# Patient Record
Sex: Female | Born: 1970
Health system: Southern US, Community
[De-identification: ages and names within clinical notes are randomized; demographics above are authoritative.]

## PROBLEM LIST (undated history)

## (undated) DIAGNOSIS — K219 Gastro-esophageal reflux disease without esophagitis: Secondary | ICD-10-CM

## (undated) DIAGNOSIS — N760 Acute vaginitis: Secondary | ICD-10-CM

## (undated) DIAGNOSIS — B009 Herpesviral infection, unspecified: Secondary | ICD-10-CM

## (undated) DIAGNOSIS — N83209 Unspecified ovarian cyst, unspecified side: Secondary | ICD-10-CM

## (undated) DIAGNOSIS — B379 Candidiasis, unspecified: Secondary | ICD-10-CM

## (undated) DIAGNOSIS — R55 Syncope and collapse: Principal | ICD-10-CM

## (undated) DIAGNOSIS — T7840XA Allergy, unspecified, initial encounter: Secondary | ICD-10-CM

## (undated) DIAGNOSIS — D179 Benign lipomatous neoplasm, unspecified: Secondary | ICD-10-CM

## (undated) DIAGNOSIS — K589 Irritable bowel syndrome without diarrhea: Secondary | ICD-10-CM

## (undated) DIAGNOSIS — B9689 Other specified bacterial agents as the cause of diseases classified elsewhere: Secondary | ICD-10-CM

## (undated) HISTORY — DX: Syncope and collapse: R55

## (undated) HISTORY — DX: Allergy, unspecified, initial encounter: T78.40XA

## (undated) HISTORY — DX: Benign lipomatous neoplasm, unspecified: D17.9

## (undated) HISTORY — DX: Herpesviral infection, unspecified: B00.9

## (undated) HISTORY — DX: Candidiasis, unspecified: B37.9

## (undated) HISTORY — DX: Unspecified ovarian cyst, unspecified side: N83.209

## (undated) HISTORY — DX: Other specified bacterial agents as the cause of diseases classified elsewhere: N76.0

## (undated) HISTORY — DX: Acute vaginitis: B96.89

## (undated) HISTORY — DX: Irritable bowel syndrome, unspecified: K58.9

---

## 1989-04-20 HISTORY — PX: LAPAROSCOPY: SHX197

## 1992-04-20 HISTORY — PX: LIPOMA EXCISION: SHX5283

## 2006-04-20 HISTORY — PX: BREAST BIOPSY: SHX20

## 2012-02-09 ENCOUNTER — Encounter: Payer: Self-pay | Admitting: Obstetrics and Gynecology

## 2012-02-09 ENCOUNTER — Ambulatory Visit (INDEPENDENT_AMBULATORY_CARE_PROVIDER_SITE_OTHER): Payer: BC Managed Care – PPO | Admitting: Obstetrics and Gynecology

## 2012-02-09 VITALS — BP 112/62 | Ht 69.0 in | Wt 154.0 lb

## 2012-02-09 DIAGNOSIS — Z124 Encounter for screening for malignant neoplasm of cervix: Secondary | ICD-10-CM

## 2012-02-09 DIAGNOSIS — B009 Herpesviral infection, unspecified: Secondary | ICD-10-CM | POA: Insufficient documentation

## 2012-02-09 DIAGNOSIS — Z7251 High risk heterosexual behavior: Secondary | ICD-10-CM

## 2012-02-09 DIAGNOSIS — D219 Benign neoplasm of connective and other soft tissue, unspecified: Secondary | ICD-10-CM | POA: Insufficient documentation

## 2012-02-09 DIAGNOSIS — N926 Irregular menstruation, unspecified: Secondary | ICD-10-CM

## 2012-02-09 DIAGNOSIS — D259 Leiomyoma of uterus, unspecified: Secondary | ICD-10-CM

## 2012-02-09 DIAGNOSIS — N946 Dysmenorrhea, unspecified: Secondary | ICD-10-CM | POA: Insufficient documentation

## 2012-02-09 LAB — HEPATITIS B SURFACE ANTIGEN: Hepatitis B Surface Ag: NEGATIVE

## 2012-02-09 LAB — RPR

## 2012-02-09 NOTE — Progress Notes (Signed)
ANNUAL: New GYN  Last Pap: 08/2010 WNL: Yes Regular Periods:no Contraception: Condom  Monthly Breast exam:no Tetanus<70yrs:yes Nl.Bladder Function:yes Daily BMs:yes Healthy Diet:yes Calcium:no Mammogram:yes Date of Mammogram: 11/2011 Exercise:yes Have often Exercise: occasionally Seatbelt: yes Abuse at home: no Stressful work:yes Sigmoid-colonoscopy: Yes Bone Density: No PCP: Dr. Ursula Beath Change in PMH: None Change in [facility]: None  Pt c/o irregular cycles. States menstrual attempted to start on 10/14, started heavy on 10/16 and was off on 10/18. Then attempted to start again on 10/20-10/21. C/o lower left abdomin and pelvic. Also wants to discuss bc pill. Pt states that she had BV in 02/20103. Wants STD testing   Subjective:    Deanna [name] is a 41 y.o. female, [id number], who presents for an annual exam.     History   Social History  . Marital Status: Single    Spouse Name: N/A    Number of Children: N/A  . Years of Education: N/A   Social History Main Topics  . Smoking status: Never Smoker   . Smokeless tobacco: Never Used  . Alcohol Use: Yes  . Drug Use: No  . Sexually Active: Yes    Birth Control/ Protection: Condom   Other Topics Concern  . None   Social History Narrative  . None    Menstrual cycle:   LMP: Patient's last menstrual period was 02/01/2012.           Cycle: Unpredictable, but usually once monthly for 4-7 days  The following portions of the patient's history were reviewed and updated as appropriate: allergies, current medications, past family history, past medical history, past social history, past surgical history and problem list.  Review of Systems Pertinent items are noted in HPI. Breast:Negative for breast lump,nipple discharge or nipple retraction Gastrointestinal: Negative for abdominal pain, change in bowel habits or rectal bleeding Urinary:negative   Objective:    LMP 02/01/2012    Weight:  Wt Readings from Last 1  Encounters:  No data found for Wt          BMI: There is no height or weight on file to calculate BMI.  General Appearance: Alert, appropriate appearance for age. No acute distress HEENT: Grossly normal Neck / Thyroid: Supple, no masses, nodes or enlargement Lungs: clear to auscultation bilaterally Back: No CVA tenderness Breast Exam: No masses or nodes.No dimpling, nipple retraction or discharge. Cardiovascular: Regular rate and rhythm. S1, S2, no murmur Gastrointestinal: Soft, non-tender, no masses or organomegaly Pelvic Exam: External genitalia: normal general appearance Vaginal: normal rugae Cervix: normal appearance Adnexa: no masses noted Uterus: anteverted, irregular enlargement and 12 weeks size and nontender Rectal: no masses Rectovaginal: normal rectal, no masses Lymphatic Exam: Non-palpable nodes in neck, clavicular, axillary, or inguinal regions Skin: no rash or abnormalities Neurologic: Normal gait and speech, no tremor  Psychiatric: Alert and oriented, appropriate affect.   Wet Prep:not applicable Urinalysis:not applicable UPT: Negative   Assessment:    fibroids  Irregular menstrual bleeding Wants contraception   Plan:    mammogram pap smear [facility] STD screening: agreed Contraception:oral contraceptives (estrogen/progesterone) desired.  Will rx at time of shg   Dierdre Forth MD

## 2012-02-10 LAB — HSV 1 ANTIBODY, IGG: HSV 1 Glycoprotein G Ab, IgG: 9.53 IV — ABNORMAL HIGH

## 2012-02-10 LAB — HSV 2 ANTIBODY, IGG: HSV 2 Glycoprotein G Ab, IgG: 0.15 IV

## 2012-02-11 ENCOUNTER — Encounter: Payer: Self-pay | Admitting: Obstetrics and Gynecology

## 2012-02-11 ENCOUNTER — Telehealth: Payer: Self-pay | Admitting: Obstetrics and Gynecology

## 2012-02-11 ENCOUNTER — Ambulatory Visit (INDEPENDENT_AMBULATORY_CARE_PROVIDER_SITE_OTHER): Payer: BC Managed Care – PPO | Admitting: Obstetrics and Gynecology

## 2012-02-11 ENCOUNTER — Other Ambulatory Visit: Payer: Self-pay | Admitting: Obstetrics and Gynecology

## 2012-02-11 ENCOUNTER — Ambulatory Visit (INDEPENDENT_AMBULATORY_CARE_PROVIDER_SITE_OTHER): Payer: BC Managed Care – PPO

## 2012-02-11 VITALS — BP 110/60 | Ht 69.0 in | Wt 152.0 lb

## 2012-02-11 DIAGNOSIS — D259 Leiomyoma of uterus, unspecified: Secondary | ICD-10-CM

## 2012-02-11 DIAGNOSIS — D219 Benign neoplasm of connective and other soft tissue, unspecified: Secondary | ICD-10-CM

## 2012-02-11 DIAGNOSIS — N939 Abnormal uterine and vaginal bleeding, unspecified: Secondary | ICD-10-CM

## 2012-02-11 DIAGNOSIS — R102 Pelvic and perineal pain: Secondary | ICD-10-CM

## 2012-02-11 DIAGNOSIS — N926 Irregular menstruation, unspecified: Secondary | ICD-10-CM

## 2012-02-11 DIAGNOSIS — N949 Unspecified condition associated with female genital organs and menstrual cycle: Secondary | ICD-10-CM

## 2012-02-11 LAB — PAP IG, CT-NG NAA, HPV HIGH-RISK
Chlamydia Probe Amp: NEGATIVE
HPV DNA High Risk: NOT DETECTED

## 2012-02-11 MED ORDER — LEVONORGESTREL-ETHINYL ESTRAD 0.1-20 MG-MCG PO TABS: 1.0000 | ORAL_TABLET | Freq: Every day | ORAL | Status: DC

## 2012-02-11 MED ORDER — VALACYCLOVIR HCL 500 MG PO TABS: 500.0000 mg | ORAL_TABLET | Freq: Every day | ORAL | Status: DC

## 2012-02-11 NOTE — Progress Notes (Signed)
GYN PROBLEM VISIT  Ms. Deanna [name] is a 41 y.o. year old female,G3P0021, who presents for a problem visit. She has irregular menses and abdominal pelvic pain  Subjective: Had one episode of severe LLQ pain in the spring.  Seen by MD who thought one of her "fibroids had ruptured".  Pain resolved over time and has not returned with that severity.  Objective:  BP 110/60  Ht 5\' 9"  (1.753 m)  Wt 152 lb (68.947 kg)  BMI 22.45 kg/m2  LMP 02/01/2012   SONOHYSTEROGRAM  Indications for the procedure, risks and benefits have been reviewed.  Questions were answered.   A permit has been signed.  An ultrasound was performed.   The uterus measured 10 cm x 8 cm.   The endometrial thickness was 7.21mm.   The ovaries nl   PROCEDURE The vagina and cervix were prepped with Betadine.  The sonohysterogram catheter was placed inside the uterus.  15 cc of sterile saline were injected.  A 3-D ultrasound was performed.  Findings include:  6 fibroids Largest 3.97cm    No endometrial lesions    EM cavity width=2.5cm    EM cavity length=5.7cm .   ENDOMETRIAL BIOPSY   INDICATION: intermenstrual bleeding  LMP: 02/01/12 UPT: Negative  Ultrasound findings: see above Risks and benefits have been discussed  PROCEDURE Cervix prepped with betadine Tenaculum applied to anterior lip of the cervix: no Uterus sounded at 7  cm Endometrial biopsy easily performed with Pipelle Well tolerated   Assessment: Fibroids  Episode intermenstrual bleeding Needs contraception  Plan: Endo bx with results by phone Start Alesse today F/U 6 wks for review of bleeding and pain  Dierdre Forth, MD  02/11/2012 7:05 PM

## 2012-02-12 ENCOUNTER — Other Ambulatory Visit: Payer: Self-pay

## 2012-02-12 MED ORDER — ACYCLOVIR 200 MG PO CAPS: 200.0000 mg | ORAL_CAPSULE | Freq: Every day | ORAL | Status: DC

## 2012-03-23 ENCOUNTER — Encounter: Payer: Self-pay | Admitting: Obstetrics and Gynecology

## 2012-03-23 ENCOUNTER — Ambulatory Visit (INDEPENDENT_AMBULATORY_CARE_PROVIDER_SITE_OTHER): Payer: BC Managed Care – PPO | Admitting: Obstetrics and Gynecology

## 2012-03-23 VITALS — BP 110/68 | HR 74 | Wt 115.0 lb

## 2012-03-23 DIAGNOSIS — D219 Benign neoplasm of connective and other soft tissue, unspecified: Secondary | ICD-10-CM

## 2012-03-23 DIAGNOSIS — N946 Dysmenorrhea, unspecified: Secondary | ICD-10-CM

## 2012-03-23 DIAGNOSIS — D259 Leiomyoma of uterus, unspecified: Secondary | ICD-10-CM

## 2012-03-23 NOTE — Progress Notes (Signed)
Subjective:    Deanna [name] is a 41 y.o. female, G3P0021, who presents for 6 weeks f/u for Birth Control.Pt stated been having occassional  leg cramps and  Some nausea which she has had associated with GERD since she only takes her medication prn.  Pt had a mammogram 6 mos ago at the [facility] hospital with the recommendation for followup in 6 mos.  She wants a second opinion concerning this recommendation   The following portions of the patient's history were reviewed and updated as appropriate: allergies, current medications, past family history.  Review of Systems Pertinent items are noted in HPI. Breast:Negative for breast lump,nipple discharge or nipple retraction Gastrointestinal: Negative for abdominal pain, change in bowel habits or rectal bleeding Urinary:negative   Objective:    BP 110/68  Pulse 74  Wt 115 lb (52.164 kg)  LMP 03/15/2012    Weight:  Wt Readings from Last 1 Encounters:  03/23/12 115 lb (52.164 kg)          BMI: There is no height on file to calculate BMI.  General Appearance: Alert, appropriate appearance for age. No acute distress    Assessment:    Fibroids  Menorrhagia Dysmenorrhea  All improved on bcps Needs short interval mg f/u   Plan:    Continue bcps Referred to Breast center for second opinion F/u for aex  Dierdre Forth MD

## 2012-03-30 ENCOUNTER — Telehealth: Payer: Self-pay

## 2012-03-30 NOTE — Telephone Encounter (Signed)
Tc to pt per [facility] recs. Pt informed per Breast Center will need last 2 copies of mammogram/pictures. Pt voices understanding. They will need these records before appt can be sched. After review of records, Breast Center will call pt to sched appt for 2nd opinion. Pt will drop of records to Breast center herself. Pt agrees.

## 2012-03-30 NOTE — Telephone Encounter (Signed)
Message copied by Raylene Everts on Wed Mar 30, 2012 10:16 AM ------      Message from: Dierdre Forth P      Created: Wed Mar 23, 2012 10:22 AM      Regarding: Radiology consultation       Pt had mammogram at [facility] in [place] and wants a second reading to determine need for followup they have recommended.  Has had a left breast bx more than 5 years ago.      Please call breast center to see what is required to give this opinion

## 2012-11-03 ENCOUNTER — Emergency Department (HOSPITAL_COMMUNITY)
Admission: EM | Admit: 2012-11-03 | Discharge: 2012-11-03 | Disposition: A | Discharge status: Home / Self Care | Payer: BC Managed Care – PPO | Attending: Emergency Medicine | Admitting: Emergency Medicine

## 2012-11-03 ENCOUNTER — Encounter (HOSPITAL_COMMUNITY): Payer: Self-pay

## 2012-11-03 ENCOUNTER — Emergency Department (HOSPITAL_COMMUNITY): Payer: BC Managed Care – PPO

## 2012-11-03 DIAGNOSIS — IMO0002 Reserved for concepts with insufficient information to code with codable children: Secondary | ICD-10-CM | POA: Insufficient documentation

## 2012-11-03 DIAGNOSIS — Z8742 Personal history of other diseases of the female genital tract: Secondary | ICD-10-CM | POA: Insufficient documentation

## 2012-11-03 DIAGNOSIS — K219 Gastro-esophageal reflux disease without esophagitis: Secondary | ICD-10-CM

## 2012-11-03 DIAGNOSIS — Z79899 Other long term (current) drug therapy: Secondary | ICD-10-CM | POA: Insufficient documentation

## 2012-11-03 DIAGNOSIS — Z8619 Personal history of other infectious and parasitic diseases: Secondary | ICD-10-CM | POA: Insufficient documentation

## 2012-11-03 DIAGNOSIS — Z8719 Personal history of other diseases of the digestive system: Secondary | ICD-10-CM | POA: Insufficient documentation

## 2012-11-03 DIAGNOSIS — Z3202 Encounter for pregnancy test, result negative: Secondary | ICD-10-CM | POA: Insufficient documentation

## 2012-11-03 LAB — HEPATIC FUNCTION PANEL
AST: 15 U/L (ref 0–37)
Bilirubin, Direct: 0.1 mg/dL (ref 0.0–0.3)
Indirect Bilirubin: 0.7 mg/dL (ref 0.3–0.9)

## 2012-11-03 LAB — CBC
MCV: 84.4 fL (ref 78.0–100.0)
Platelets: 235 10*3/uL (ref 150–400)
RDW: 13.4 % (ref 11.5–15.5)
WBC: 4.2 10*3/uL (ref 4.0–10.5)

## 2012-11-03 LAB — BASIC METABOLIC PANEL
Chloride: 103 mEq/L (ref 96–112)
Creatinine, Ser: 0.68 mg/dL (ref 0.50–1.10)
GFR calc Af Amer: >90 mL/min (ref >90)
GFR calc non Af Amer: >90 mL/min (ref >90)

## 2012-11-03 LAB — TROPONIN I: Troponin I: <0.3 ng/mL (ref <0.30)

## 2012-11-03 LAB — POCT PREGNANCY, URINE: Preg Test, Ur: NEGATIVE

## 2012-11-03 LAB — LIPASE, BLOOD: Lipase: 39 U/L (ref 11–59)

## 2012-11-03 MED ORDER — PANTOPRAZOLE SODIUM 20 MG PO TBEC: 40.0000 mg | DELAYED_RELEASE_TABLET | Freq: Every day | ORAL | Status: DC

## 2012-11-03 NOTE — ED Provider Notes (Signed)
History    CSN: [id number] Arrival date & time 11/03/12  1122  First MD Initiated Contact with Patient 11/03/12 1139     Chief Complaint  Patient presents with  . Chest Pain   (Consider location/radiation/quality/duration/timing/severity/associated sxs/prior Treatment) Patient is a 42 y.o. female presenting with chest pain. The history is provided by the patient (pt complains burning in chest).  Chest Pain Pain location:  Substernal area Pain quality: burning   Pain radiates to:  Does not radiate Pain radiates to the back: no   Pain severity:  Moderate Onset quality:  Gradual Timing:  Constant Associated symptoms: no abdominal pain, no back pain, no cough, no fatigue and no headache    Past Medical History  Diagnosis Date  . Yeast infection   . BV (bacterial vaginosis)   . Ovarian cyst   . HSV-1 infection   . IBS (irritable bowel syndrome)    Past Surgical History  Procedure Laterality Date  . Breast biopsy  2008  . Laparoscopy  1991    no endometriosis found   Family History  Problem Relation Age of Onset  . Heart disease Maternal Grandmother   . Hypertension Maternal Grandmother   . Sickle cell trait Cousin   . Cancer Maternal Aunt   . Anemia Mother   . Throat cancer Paternal Uncle   . Cancer Paternal Uncle     unsure ?    History  Substance Use Topics  . Smoking status: Never Smoker   . Smokeless tobacco: Never Used  . Alcohol Use: Yes     Comment: wine occassional   OB History   Grav Para Term Preterm Abortions TAB SAB Ect Mult Living   3 1   2 1 1   1      Review of Systems  Constitutional: Negative for appetite change and fatigue.  HENT: Negative for congestion, sinus pressure and ear discharge.   Eyes: Negative for discharge.  Respiratory: Negative for cough.   Cardiovascular: Positive for chest pain.  Gastrointestinal: Negative for abdominal pain and diarrhea.  Genitourinary: Negative for frequency and hematuria.  Musculoskeletal: Negative  for back pain.  Skin: Negative for rash.  Neurological: Negative for seizures and headaches.  Psychiatric/Behavioral: Negative for hallucinations.    Allergies  Review of patient's allergies indicates no known allergies.  Home Medications   Current Outpatient Rx  Name  Route  Sig  Dispense  Refill  . acyclovir (ZOVIRAX) 200 MG capsule   Oral   Take 1 capsule (200 mg total) by mouth 5 (five) times daily.   30 capsule   12   . ibuprofen (ADVIL,MOTRIN) 200 MG tablet   Oral   Take 200 mg by mouth every 6 (six) hours as needed for pain.         Marland Kitchen levonorgestrel-ethinyl estradiol (AVIANE,ALESSE,LESSINA) 0.1-20 MG-MCG tablet   Oral   Take 1 tablet by mouth daily.         . mometasone (NASONEX) 50 MCG/ACT nasal spray   Nasal   Place 2 sprays into the nose daily.         . Multiple Vitamins-Minerals (ONE-A-DAY MENS VITACRAVES) CHEW   Oral   Chew 1 tablet by mouth daily.         . naproxen sodium (ANAPROX) 220 MG tablet   Oral   Take 220 mg by mouth 2 (two) times daily as needed. For pain         . Omega-3 Fatty Acids (FISH OIL) 1000 MG  CAPS   Oral   Take 1 capsule by mouth daily.         . pantoprazole (PROTONIX) 20 MG tablet   Oral   Take 2 tablets (40 mg total) by mouth daily.   30 tablet   1    BP 135/87  Pulse 82  Temp(Src) 98.6 F (37 C) (Oral)  Resp 18  SpO2 100%  LMP 10/10/2012 Physical Exam  Constitutional: She is oriented to person, place, and time. She appears well-developed.  HENT:  Head: Normocephalic.  Eyes: Conjunctivae and EOM are normal. No scleral icterus.  Neck: Neck supple. No thyromegaly present.  Cardiovascular: Normal rate and regular rhythm.  Exam reveals no gallop and no friction rub.   No murmur heard. Pulmonary/Chest: No stridor. She has no wheezes. She has no rales. She exhibits no tenderness.  Abdominal: She exhibits no distension. There is no tenderness. There is no rebound.  Musculoskeletal: Normal range of motion.  She exhibits no edema.  Lymphadenopathy:    She has no cervical adenopathy.  Neurological: She is oriented to person, place, and time. Coordination normal.  Skin: No rash noted. No erythema.  Psychiatric: She has a normal mood and affect. Her behavior is normal.    ED Course  Procedures (including critical care time) Labs Reviewed  CBC - Abnormal; Notable for the following:    HCT 35.7 (*)    All other components within normal limits  BASIC METABOLIC PANEL - Abnormal; Notable for the following:    BUN 5 (*)    All other components within normal limits  TROPONIN I  HEPATIC FUNCTION PANEL  LIPASE, BLOOD  POCT PREGNANCY, URINE   Dg Chest 2 View  11/03/2012   *RADIOLOGY REPORT*  Clinical Data: Inferior retrosternal chest pain radiating bilaterally under the breasts for 1 day, history asthma, nausea  CHEST - 2 VIEW  Comparison: None  Findings: Normal heart size, mediastinal contours, and pulmonary vascularity. Lungs clear. Bones unremarkable. No pneumothorax.  IMPRESSION: Normal exam.   Original Report Authenticated By: Ulyses Southward, M.D.   Dg Abd 2 Views  11/03/2012   *RADIOLOGY REPORT*  Clinical Data: Inferior retrosternal chest pain radiating bilaterally under the breasts for 1 day, nausea, reflux like symptoms, history endometriosis  ABDOMEN - 2 VIEW  Comparison: None  Findings: Normal bowel gas pattern. No bowel dilatation, bowel wall thickening or free intraperitoneal air. No urinary tract calcification or a few focal bony lesions. Large rounded calcifications in the pelvis potentially may represent calcified uterine leiomyomata, largest 4.5 x 4.0 cm in size.  IMPRESSION: Normal bowel gas pattern. Pelvic calcifications question calcified uterine leiomyomata.   Original Report Authenticated By: Ulyses Southward, M.D.   1. GERD (gastroesophageal reflux disease)     Date: 11/03/2012  Rate: 79  Rhythm: normal sinus rhythm  QRS Axis: normal  Intervals: normal  ST/T Wave abnormalities: normal   Conduction Disutrbances:none  Narrative Interpretation:   Old EKG Reviewed: none available   MDM  gerd  Benny Lennert, MD 11/03/12 1309

## 2012-11-03 NOTE — ED Notes (Signed)
Patient transported to X-ray 

## 2012-11-03 NOTE — ED Notes (Signed)
Pt. Developed intermittent chest pain began Early Tuesday morning, woke the pt.up.  She describes the pain as a cramping and it is intermittent.  The pain  Radiates under the lt. Breast.  Also having cramping in both legs and pain in the top of her feet.  Pt, is not having any sob.   Pt. Is having allergies.skin is warm and dry.

## 2012-11-03 NOTE — ED Notes (Signed)
Returned from xray

## 2013-04-25 ENCOUNTER — Encounter (HOSPITAL_COMMUNITY): Payer: Self-pay | Admitting: Emergency Medicine

## 2013-04-25 ENCOUNTER — Emergency Department (HOSPITAL_COMMUNITY)
Admission: EM | Admit: 2013-04-25 | Discharge: 2013-04-25 | Disposition: A | Discharge status: Home / Self Care | Payer: BC Managed Care – PPO | Source: Home / Self Care | Attending: Family Medicine | Admitting: Family Medicine

## 2013-04-25 ENCOUNTER — Telehealth (HOSPITAL_COMMUNITY): Payer: Self-pay | Admitting: *Deleted

## 2013-04-25 DIAGNOSIS — R110 Nausea: Secondary | ICD-10-CM

## 2013-04-25 LAB — CBC
HCT: 38.1 % (ref 36.0–46.0)
Hemoglobin: 12.6 g/dL (ref 12.0–15.0)
MCH: 28.8 pg (ref 26.0–34.0)
MCHC: 33.1 g/dL (ref 30.0–36.0)
MCV: 87 fL (ref 78.0–100.0)
PLATELETS: 196 10*3/uL (ref 150–400)
RBC: 4.38 MIL/uL (ref 3.87–5.11)
RDW: 13 % (ref 11.5–15.5)
WBC: 4.9 10*3/uL (ref 4.0–10.5)

## 2013-04-25 LAB — LIPASE, BLOOD: LIPASE: 43 U/L (ref 11–59)

## 2013-04-25 LAB — POCT URINALYSIS DIP (DEVICE)
BILIRUBIN URINE: NEGATIVE
GLUCOSE, UA: NEGATIVE mg/dL
Hgb urine dipstick: NEGATIVE
Ketones, ur: NEGATIVE mg/dL
NITRITE: NEGATIVE
PH: 6.5 (ref 5.0–8.0)
Protein, ur: NEGATIVE mg/dL
Specific Gravity, Urine: 1.015 (ref 1.005–1.030)
Urobilinogen, UA: 0.2 mg/dL (ref 0.0–1.0)

## 2013-04-25 LAB — COMPREHENSIVE METABOLIC PANEL
ALK PHOS: 68 U/L (ref 39–117)
ALT: 10 U/L (ref 0–35)
AST: 13 U/L (ref 0–37)
Albumin: 4.1 g/dL (ref 3.5–5.2)
BILIRUBIN TOTAL: 0.5 mg/dL (ref 0.3–1.2)
BUN: 10 mg/dL (ref 6–23)
CHLORIDE: 100 meq/L (ref 96–112)
CO2: 26 mEq/L (ref 19–32)
CREATININE: 0.7 mg/dL (ref 0.50–1.10)
Calcium: 8.8 mg/dL (ref 8.4–10.5)
GLUCOSE: 84 mg/dL (ref 70–99)
POTASSIUM: 3.9 meq/L (ref 3.7–5.3)
Sodium: 138 mEq/L (ref 137–147)
Total Protein: 7.6 g/dL (ref 6.0–8.3)

## 2013-04-25 LAB — POCT PREGNANCY, URINE: PREG TEST UR: NEGATIVE

## 2013-04-25 MED ORDER — ONDANSETRON HCL 8 MG PO TABS: 8.0000 mg | ORAL_TABLET | Freq: Three times a day (TID) | ORAL | Status: DC | PRN

## 2013-04-25 NOTE — Discharge Instructions (Signed)
Thank you for coming in today. Use Zofran as needed Try stopping omega-3 fatty acid as this may cause some upset stomach Continue Protonix Followup with a primary care provider I will call you if any labs are very abnormal  Nausea, Adult Nausea is the feeling that you have an upset stomach or have to vomit. Nausea by itself is not likely a serious concern, but it may be an early sign of more serious medical problems. As nausea gets worse, it can lead to vomiting. If vomiting develops, there is the risk of dehydration.  CAUSES   Viral infections.  Food poisoning.  Medicines.  Pregnancy.  Motion sickness.  Migraine headaches.  Emotional distress.  Severe pain from any source.  Alcohol intoxication. HOME CARE INSTRUCTIONS  Get plenty of rest.  Ask your caregiver about specific rehydration instructions.  Eat small amounts of food and sip liquids more often.  Take all medicines as told by your caregiver. SEEK MEDICAL CARE IF:  You have not improved after 2 days, or you get worse.  You have a headache. SEEK IMMEDIATE MEDICAL CARE IF:   You have a fever.  You faint.  You keep vomiting or have blood in your vomit.  You are extremely weak or dehydrated.  You have dark or bloody stools.  You have severe chest or abdominal pain. MAKE SURE YOU:  Understand these instructions.  Will watch your condition.  Will get help right away if you are not doing well or get worse. Document Released: 05/14/2004 Document Revised: 12/30/2011 Document Reviewed: 12/17/2010 [facility] Patient Information 2014 [place], [place].

## 2013-04-25 NOTE — ED Provider Notes (Signed)
Deanna [name] is a 43 y.o. female who presents to Urgent Care today for one week of nausea. Patient has tried taking laxatives and multivitamins which haven't helped. No constipation vomiting diarrhea. No fevers or chills. Patient had a negative home pregnancy test. She denies abdominal pain shortness of breath fevers or chills. She has been exposed to someone recently who developed gastroenteritis. She feels well otherwise. No dysuria or urinary frequency or urgency.  Past Medical History  Diagnosis Date  . Yeast infection   . BV (bacterial vaginosis)   . Ovarian cyst   . HSV-1 infection   . IBS (irritable bowel syndrome)    History  Substance Use Topics  . Smoking status: Never Smoker   . Smokeless tobacco: Never Used  . Alcohol Use: Yes     Comment: wine occassional   ROS as above Medications reviewed. No current facility-administered medications for this encounter.   Current Outpatient Prescriptions  Medication Sig Dispense Refill  . cetirizine (ZYRTEC) 10 MG tablet Take 10 mg by mouth daily.      . tranexamic acid (LYSTEDA) 650 MG TABS tablet Take 1,300 mg by mouth 3 (three) times daily.      Marland Kitchen acyclovir (ZOVIRAX) 200 MG capsule Take 1 capsule (200 mg total) by mouth 5 (five) times daily.  30 capsule  12  . ibuprofen (ADVIL,MOTRIN) 200 MG tablet Take 200 mg by mouth every 6 (six) hours as needed for pain.      Marland Kitchen levonorgestrel-ethinyl estradiol (AVIANE,ALESSE,LESSINA) 0.1-20 MG-MCG tablet Take 1 tablet by mouth daily.      . mometasone (NASONEX) 50 MCG/ACT nasal spray Place 2 sprays into the nose daily.      . Multiple Vitamins-Minerals (ONE-A-DAY MENS VITACRAVES) CHEW Chew 1 tablet by mouth daily.      . naproxen sodium (ANAPROX) 220 MG tablet Take 220 mg by mouth 2 (two) times daily as needed. For pain      . Omega-3 Fatty Acids (FISH OIL) 1000 MG CAPS Take 1 capsule by mouth daily.      . ondansetron (ZOFRAN) 8 MG tablet Take 1 tablet (8 mg total) by mouth every 8 (eight)  hours as needed for nausea or vomiting.  20 tablet  0  . pantoprazole (PROTONIX) 20 MG tablet Take 2 tablets (40 mg total) by mouth daily.  30 tablet  1    Exam:  BP 108/71  Pulse 78  Temp(Src) 98 F (36.7 C) (Oral)  Resp 18  SpO2 100%  LMP 04/02/2013 Gen: Well NAD HEENT: EOMI,  MMM posterior pharynx is normal appearing Lungs: Normal work of breathing. CTABL Heart: RRR no MRG Abd: NABS, Soft. NT, ND no CV angle tenderness to percussion Exts: Non edematous BL  LE, warm and well perfused.   Results for orders placed during the hospital encounter of 04/25/13 (from the past 24 hour(s))  COMPREHENSIVE METABOLIC PANEL     Status: None   Collection Time    04/25/13 12:28 PM      Result Value Range   Sodium 138  137 - 147 mEq/L   Potassium 3.9  3.7 - 5.3 mEq/L   Chloride 100  96 - 112 mEq/L   CO2 26  19 - 32 mEq/L   Glucose, Bld 84  70 - 99 mg/dL   BUN 10  6 - 23 mg/dL   Creatinine, Ser 0.70  0.50 - 1.10 mg/dL   Calcium 8.8  8.4 - 10.5 mg/dL   Total Protein 7.6  6.0 -  8.3 g/dL   Albumin 4.1  3.5 - 5.2 g/dL   AST 13  0 - 37 U/L   ALT 10  0 - 35 U/L   Alkaline Phosphatase 68  39 - 117 U/L   Total Bilirubin 0.5  0.3 - 1.2 mg/dL   GFR calc non Af Amer >90  >90 mL/min   GFR calc Af Amer >90  >90 mL/min  CBC     Status: None   Collection Time    04/25/13 12:28 PM      Result Value Range   WBC 4.9  4.0 - 10.5 K/uL   RBC 4.38  3.87 - 5.11 MIL/uL   Hemoglobin 12.6  12.0 - 15.0 g/dL   HCT 38.1  36.0 - 46.0 %   MCV 87.0  78.0 - 100.0 fL   MCH 28.8  26.0 - 34.0 pg   MCHC 33.1  30.0 - 36.0 g/dL   RDW 13.0  11.5 - 15.5 %   Platelets 196  150 - 400 K/uL  LIPASE, BLOOD     Status: None   Collection Time    04/25/13 12:28 PM      Result Value Range   Lipase 43  11 - 59 U/L  POCT URINALYSIS DIP (DEVICE)     Status: Abnormal   Collection Time    04/25/13 12:39 PM      Result Value Range   Glucose, UA NEGATIVE  NEGATIVE mg/dL   Bilirubin Urine NEGATIVE  NEGATIVE   Ketones, ur  NEGATIVE  NEGATIVE mg/dL   Specific Gravity, Urine 1.015  1.005 - 1.030   Hgb urine dipstick NEGATIVE  NEGATIVE   pH 6.5  5.0 - 8.0   Protein, ur NEGATIVE  NEGATIVE mg/dL   Urobilinogen, UA 0.2  0.0 - 1.0 mg/dL   Nitrite NEGATIVE  NEGATIVE   Leukocytes, UA TRACE (*) NEGATIVE  POCT PREGNANCY, URINE     Status: None   Collection Time    04/25/13 12:39 PM      Result Value Range   Preg Test, Ur NEGATIVE  NEGATIVE   No results found.  Assessment and Plan: 43 y.o. female with nausea of unclear etiology. Exam and labs are largely normal. Urinalysis with trace leukocyte esterase without any urinary symptoms. I think this is likely not significant however a urine culture is pending. Plan to treat with Zofran and watchful waiting..  Discussed warning signs or symptoms. Please see discharge instructions. Patient expresses understanding.      Gregor Hams, MD 04/25/13 [phone]

## 2013-04-25 NOTE — ED Notes (Signed)
Pt. called for her lab results.  Pt. verified x 2 and given neg. results.  I told her the Urine culture would be back on Thur. I gave her my number to call on Fri for her result. Deanna [name] 04/25/2013

## 2013-04-25 NOTE — ED Notes (Signed)
C/o off and on nausea for over 1 week. States she has taken vitamins. Took a laxative to see if it was constipation, nausea did not go away. Also took a home pregnancy test that was negative.  Stated last night while walking in mall, she felt very week. Written by; Lenore Manner, SMA

## 2013-04-26 LAB — URINE CULTURE
COLONY COUNT: NO GROWTH
Culture: NO GROWTH
Special Requests: NORMAL

## 2013-05-01 ENCOUNTER — Other Ambulatory Visit: Payer: Self-pay | Admitting: Internal Medicine

## 2013-05-01 DIAGNOSIS — R110 Nausea: Secondary | ICD-10-CM

## 2013-05-03 ENCOUNTER — Ambulatory Visit
Admission: RE | Admit: 2013-05-03 | Discharge: 2013-05-03 | Disposition: A | Discharge status: Home / Self Care | Payer: BC Managed Care – PPO | Source: Ambulatory Visit | Attending: Internal Medicine | Admitting: Internal Medicine

## 2013-05-03 DIAGNOSIS — R110 Nausea: Secondary | ICD-10-CM

## 2013-05-30 ENCOUNTER — Other Ambulatory Visit: Payer: Self-pay | Admitting: Nurse Practitioner

## 2013-05-30 ENCOUNTER — Ambulatory Visit
Admission: RE | Admit: 2013-05-30 | Discharge: 2013-05-30 | Disposition: A | Discharge status: Home / Self Care | Payer: BC Managed Care – PPO | Source: Ambulatory Visit | Attending: Nurse Practitioner | Admitting: Nurse Practitioner

## 2013-05-30 DIAGNOSIS — M79609 Pain in unspecified limb: Secondary | ICD-10-CM

## 2013-12-20 ENCOUNTER — Other Ambulatory Visit (HOSPITAL_COMMUNITY)
Admission: RE | Admit: 2013-12-20 | Discharge: 2013-12-20 | Disposition: A | Discharge status: Home / Self Care | Payer: BC Managed Care – PPO | Source: Ambulatory Visit | Attending: Family Medicine | Admitting: Family Medicine

## 2013-12-20 ENCOUNTER — Other Ambulatory Visit: Payer: Self-pay | Admitting: Physician Assistant

## 2013-12-20 DIAGNOSIS — Z124 Encounter for screening for malignant neoplasm of cervix: Secondary | ICD-10-CM | POA: Diagnosis present

## 2013-12-22 LAB — CYTOLOGY - PAP

## 2014-02-06 ENCOUNTER — Other Ambulatory Visit: Payer: Self-pay | Admitting: Physician Assistant

## 2014-02-06 DIAGNOSIS — R42 Dizziness and giddiness: Secondary | ICD-10-CM

## 2014-02-09 ENCOUNTER — Ambulatory Visit
Admission: RE | Admit: 2014-02-09 | Discharge: 2014-02-09 | Disposition: A | Discharge status: Home / Self Care | Payer: BC Managed Care – PPO | Source: Ambulatory Visit | Attending: Physician Assistant | Admitting: Physician Assistant

## 2014-02-09 DIAGNOSIS — R42 Dizziness and giddiness: Secondary | ICD-10-CM

## 2014-02-09 MED ORDER — GADOBENATE DIMEGLUMINE 529 MG/ML IV SOLN
14.0000 mL | Freq: Once | INTRAVENOUS | Status: AC | PRN
  Administered 2014-02-09: 14 mL via INTRAVENOUS

## 2014-02-19 ENCOUNTER — Encounter (HOSPITAL_COMMUNITY): Payer: Self-pay | Admitting: Emergency Medicine

## 2014-02-21 ENCOUNTER — Ambulatory Visit (INDEPENDENT_AMBULATORY_CARE_PROVIDER_SITE_OTHER): Payer: BC Managed Care – PPO | Admitting: Neurology

## 2014-02-21 ENCOUNTER — Encounter: Payer: Self-pay | Admitting: Neurology

## 2014-02-21 VITALS — BP 118/72 | HR 84 | Ht 69.0 in | Wt 157.2 lb

## 2014-02-21 DIAGNOSIS — R55 Syncope and collapse: Secondary | ICD-10-CM | POA: Insufficient documentation

## 2014-02-21 HISTORY — DX: Syncope and collapse: R55

## 2014-02-21 NOTE — Progress Notes (Signed)
Reason for visit: near-syncope  Deanna [name] is a 42 y.o. female  History of present illness:  Deanna [name] is a 43 year old right-handed black female with a 4 or 5 year history of episodes of near-syncope. The patient indicates that the events may occur once or twice a month, and there are no particular activating factors for them. The patient will have a sensation of feeling lightheaded as if she may faint. The patient is unable to continue to remain upright, and she will generally have to sit down or lie down. The patient indicates that even while lying down, she still feels somewhat dizzy lasting 20-30 minutes, with resolution at that time. The patient may have nausea associated with the events. The patient denies any focal numbness or weakness of the face, arms, or legs. She denies palpitations of the heart. She has had a 2-D echocardiogram many years ago for similar events, but she is unclear what the results of the study were. The patient denies any headache episodes with the events, and she denies any confusion or loss of consciousness. She is sent to this office for an evaluation. She has undergone MRI evaluation of the brain that was unremarkable.    Past Medical History  Diagnosis Date  . Yeast infection   . BV (bacterial vaginosis)   . Ovarian cyst   . HSV-1 infection   . IBS (irritable bowel syndrome)   . Syncope 02/21/2014  . Near syncope 02/21/2014    Past Surgical History  Procedure Laterality Date  . Breast biopsy  2008  . Laparoscopy  1991    no endometriosis found    Family History  Problem Relation Age of Onset  . Heart disease Maternal Grandmother   . Hypertension Maternal Grandmother   . Sickle cell trait Cousin   . Cancer Maternal Aunt   . Anemia Mother   . Throat cancer Paternal Uncle   . Cancer Paternal Uncle     unsure ?     Social history:  reports that she has never smoked. She has never used smokeless tobacco. She reports that she drinks  alcohol. She reports that she does not use illicit drugs.  Medications:  Current Outpatient Prescriptions on File Prior to Visit  Medication Sig Dispense Refill  . acyclovir (ZOVIRAX) 200 MG capsule Take 1 capsule (200 mg total) by mouth 5 (five) times daily. 30 capsule 12  . cetirizine (ZYRTEC) 10 MG tablet Take 10 mg by mouth daily.    Marland Kitchen ibuprofen (ADVIL,MOTRIN) 200 MG tablet Take 200 mg by mouth every 6 (six) hours as needed for pain.    . mometasone (NASONEX) 50 MCG/ACT nasal spray Place 2 sprays into the nose daily.    . Multiple Vitamins-Minerals (ONE-A-DAY MENS VITACRAVES) CHEW Chew 1 tablet by mouth daily.    . naproxen sodium (ANAPROX) 220 MG tablet Take 220 mg by mouth 2 (two) times daily as needed. For pain    . Omega-3 Fatty Acids (FISH OIL) 1000 MG CAPS Take 1 capsule by mouth daily.    . pantoprazole (PROTONIX) 20 MG tablet Take 2 tablets (40 mg total) by mouth daily. 30 tablet 1   No current facility-administered medications on file prior to visit.     No Known Allergies  ROS:  Out of a complete 14 system review of symptoms, the patient complains only of the following symptoms, and all other reviewed systems are negative.  Chest pain Ringing in the ears Moles Constipation Allergies, runny nose Headache,  weakness, dizziness Decreased energy Shift work  Blood pressure 118/72, pulse 84, height 5\' 9"  (1.753 m), weight 157 lb 3.2 oz (71.305 kg).   Blood pressure, sitting, right arm is 112/80. Blood pressure, right arm, standing is 726 systolic.  Physical Exam  General: The patient is alert and cooperative at the time of the examination.  Eyes: Pupils are equal, round, and reactive to light. Discs are flat bilaterally.  Neck: The neck is supple, no carotid bruits are noted.  Respiratory: The respiratory examination is clear.  Cardiovascular: The cardiovascular examination reveals a regular rate and rhythm, no obvious murmurs or rubs are noted.  Skin:  Extremities are without significant edema.  Neurologic Exam  Mental status: The patient is alert and oriented x 3 at the time of the examination. The patient has apparent normal recent and remote memory, with an apparently normal attention span and concentration ability.  Cranial nerves: Facial symmetry is present. There is good sensation of the face to pinprick and soft touch bilaterally. The strength of the facial muscles and the muscles to head turning and shoulder shrug are normal bilaterally. Speech is well enunciated, no aphasia or dysarthria is noted. Extraocular movements are full. Visual fields are full. The tongue is midline, and the patient has symmetric elevation of the soft palate. No obvious hearing deficits are noted.  Motor: The motor testing reveals 5 over 5 strength of all 4 extremities. Good symmetric motor tone is noted throughout.  Sensory: Sensory testing is intact to pinprick, soft touch, vibration sensation, and position sense on all 4 extremities. No evidence of extinction is noted.  Coordination: Cerebellar testing reveals good finger-nose-finger and heel-to-shin bilaterally.  Gait and station: Gait is normal. Tandem gait is normal. Romberg is negative. No drift is seen.  Reflexes: Deep tendon reflexes are symmetric and normal bilaterally. Toes are downgoing bilaterally.   MRI brain 02/10/14:  IMPRESSION: Normal brain MRI with no acute intracranial abnormality identified.   Assessment/Plan:  1. Episodic near syncope  The patient reports events of near-syncope that could represent vasovagal-type events that have been present for a number of years. The patient denies any particular activating events. The patient will be sent for a 2-D echocardiogram, and a prolonged cardiac monitoring study for at least 3 weeks. The patient will follow-up in about 4 months. If these studies are unremarkable, we may consider treatment with an SSRI antidepressant such as Prozac  which may minimize neurocardiogenic syncope.  Jill Alexanders MD 02/21/2014 7:06 PM  [facility] [address] [place], [place] [address]  Phone [phone] Fax [phone]

## 2014-02-21 NOTE — Patient Instructions (Signed)
Near-Syncope Near-syncope (commonly known as near fainting) is sudden weakness, dizziness, or feeling like you might pass out. During an episode of near-syncope, you may also develop pale skin, have tunnel vision, or feel sick to your stomach (nauseous). Near-syncope may occur when getting up after sitting or while standing for a long time. It is caused by a sudden decrease in blood flow to the brain. This decrease can result from various causes or triggers, most of which are not serious. However, because near-syncope can sometimes be a sign of something serious, a medical evaluation is required. The specific cause is often not determined. HOME CARE INSTRUCTIONS  Monitor your condition for any changes. The following actions may help to alleviate any discomfort you are experiencing:  Have someone stay with you until you feel stable.  Lie down right away and prop your feet up if you start feeling like you might faint. Breathe deeply and steadily. Wait until all the symptoms have passed. Most of these episodes last only a few minutes. You may feel tired for several hours.   Drink enough fluids to keep your urine clear or pale yellow.   If you are taking blood pressure or heart medicine, get up slowly when seated or lying down. Take several minutes to sit and then stand. This can reduce dizziness.  Follow up with your health care provider as directed. SEEK IMMEDIATE MEDICAL CARE IF:   You have a severe headache.   You have unusual pain in the chest, abdomen, or back.   You are bleeding from the mouth or rectum, or you have black or tarry stool.   You have an irregular or very fast heartbeat.   You have repeated fainting or have seizure-like jerking during an episode.   You faint when sitting or lying down.   You have confusion.   You have difficulty walking.   You have severe weakness.   You have vision problems.  MAKE SURE YOU:   Understand these instructions.  Will  watch your condition.  Will get help right away if you are not doing well or get worse. Document Released: 04/06/2005 Document Revised: 04/11/2013 Document Reviewed: 09/09/2012 ExitCare Patient Information 2015 [employer]. This information is not intended to replace advice given to you by your health care provider. Make sure you discuss any questions you have with your health care provider.  

## 2014-02-22 ENCOUNTER — Ambulatory Visit (INDEPENDENT_AMBULATORY_CARE_PROVIDER_SITE_OTHER): Payer: BC Managed Care – PPO | Admitting: Emergency Medicine

## 2014-02-22 VITALS — BP 128/74 | HR 86 | Temp 99.0°F | Resp 18 | Ht 69.0 in | Wt 157.0 lb

## 2014-02-22 DIAGNOSIS — J209 Acute bronchitis, unspecified: Secondary | ICD-10-CM

## 2014-02-22 DIAGNOSIS — J0100 Acute maxillary sinusitis, unspecified: Secondary | ICD-10-CM

## 2014-02-22 MED ORDER — AMOXICILLIN-POT CLAVULANATE 875-125 MG PO TABS: 1.0000 | ORAL_TABLET | Freq: Two times a day (BID) | ORAL | Status: DC

## 2014-02-22 MED ORDER — PSEUDOEPHEDRINE-GUAIFENESIN ER 60-600 MG PO TB12: 1.0000 | ORAL_TABLET | Freq: Two times a day (BID) | ORAL | Status: AC

## 2014-02-22 MED ORDER — HYDROCOD POLST-CHLORPHEN POLST 10-8 MG/5ML PO LQCR: 5.0000 mL | Freq: Two times a day (BID) | ORAL | Status: DC | PRN

## 2014-02-22 NOTE — Progress Notes (Signed)
[facility] [address] [place] [address] [phone]  Date:  02/22/2014   Name:  Deanna [name]   DOB:  Mar 05, 1971   MRN:  [id number]  PCP:  REDMON,NOELLE, PA-C    Chief Complaint: Cough and Nasal Congestion   History of Present Illness:  Deanna [name] is a 43 y.o. very pleasant female patient who presents with the following:  Ill for the past week with nasal congestion.  Has watery ND and purulent foul tasting PND Sore throat Cough productive mucopurulent sputum.  No wheezing or shortness of breath.  Up all night last night coughing. No fever or chills.  No nausea or vomiting.  No stool change or rash. No improvement with over the counter medications or other home remedies.  Denies other complaint or health concern today.   Patient Active Problem List   Diagnosis Date Noted  . Syncope 02/21/2014  . Near syncope 02/21/2014  . Fibroids 02/09/2012  . Dysmenorrhea 02/09/2012  . HSV-1 infection     Past Medical History  Diagnosis Date  . Yeast infection   . BV (bacterial vaginosis)   . Ovarian cyst   . HSV-1 infection   . IBS (irritable bowel syndrome)   . Syncope 02/21/2014  . Near syncope 02/21/2014  . Allergy     Past Surgical History  Procedure Laterality Date  . Breast biopsy  2008  . Laparoscopy  1991    no endometriosis found    History  Substance Use Topics  . Smoking status: Never Smoker   . Smokeless tobacco: Never Used  . Alcohol Use: 0.0 oz/week    0 Not specified per week     Comment: wine occassional    Family History  Problem Relation Age of Onset  . Heart disease Maternal Grandmother   . Hypertension Maternal Grandmother   . Sickle cell trait Cousin   . Cancer Maternal Aunt   . Anemia Mother   . Throat cancer Paternal Uncle   . Cancer Paternal Uncle     unsure ?     No Known Allergies  Medication list has been reviewed and updated.  Current Outpatient Prescriptions on File Prior to Visit  Medication Sig  Dispense Refill  . acyclovir (ZOVIRAX) 200 MG capsule Take 1 capsule (200 mg total) by mouth 5 (five) times daily. 30 capsule 12  . cetirizine (ZYRTEC) 10 MG tablet Take 10 mg by mouth daily.    Marland Kitchen ibuprofen (ADVIL,MOTRIN) 200 MG tablet Take 200 mg by mouth every 6 (six) hours as needed for pain.    . mometasone (NASONEX) 50 MCG/ACT nasal spray Place 2 sprays into the nose daily.    . Multiple Vitamins-Minerals (ONE-A-DAY MENS VITACRAVES) CHEW Chew 1 tablet by mouth daily.    . naproxen sodium (ANAPROX) 220 MG tablet Take 220 mg by mouth 2 (two) times daily as needed. For pain    . pantoprazole (PROTONIX) 20 MG tablet Take 2 tablets (40 mg total) by mouth daily. 30 tablet 1  . Omega-3 Fatty Acids (FISH OIL) 1000 MG CAPS Take 1 capsule by mouth daily.     No current facility-administered medications on file prior to visit.    Review of Systems:  As per HPI, otherwise negative.    Physical Examination: Filed Vitals:   02/22/14 1940  BP: 128/74  Pulse: 86  Temp: 99 F (37.2 C)  Resp: 18   Filed Vitals:   02/22/14 1940  Height: 5\' 9"  (1.753 m)  Weight: 157 lb (71.215 kg)   Body mass index is 23.17 kg/(m^2). Ideal Body Weight: Weight in (lb) to have BMI = 25: 168.9  GEN: WDWN, NAD, Non-toxic, A & O x 3  Frequent cough HEENT: Atraumatic, Normocephalic. Neck supple. No masses, No LAD. Ears and Nose: No external deformity. CV: RRR, No M/G/R. No JVD. No thrill. No extra heart sounds. PULM: CTA B, no wheezes, crackles, rhonchi. No retractions. No resp. distress. No accessory muscle use. ABD: S, NT, ND, +BS. No rebound. No HSM. EXTR: No c/c/e NEURO Normal gait.  PSYCH: Normally interactive. Conversant. Not depressed or anxious appearing.  Calm demeanor.    Assessment and Plan: Sinusitis Bronchitis augmentin mucinex d tussionex   Signed,  Ellison Carwin, MD

## 2014-02-22 NOTE — Patient Instructions (Signed)

## 2014-02-28 ENCOUNTER — Ambulatory Visit (HOSPITAL_COMMUNITY): Payer: BC Managed Care – PPO | Attending: Internal Medicine

## 2014-02-28 ENCOUNTER — Encounter (INDEPENDENT_AMBULATORY_CARE_PROVIDER_SITE_OTHER): Payer: BC Managed Care – PPO

## 2014-02-28 ENCOUNTER — Encounter: Payer: Self-pay | Admitting: *Deleted

## 2014-02-28 DIAGNOSIS — R55 Syncope and collapse: Secondary | ICD-10-CM

## 2014-02-28 NOTE — Progress Notes (Signed)
2D Echo completed. 02/28/2014

## 2014-02-28 NOTE — Progress Notes (Signed)
Patient ID: Deanna [name], female   DOB: 1970/05/28, 43 y.o.   MRN: [id number] Preventice verite 30 day cardiac event monitor applied to patient.

## 2014-03-01 ENCOUNTER — Telehealth: Payer: Self-pay | Admitting: Neurology

## 2014-03-01 NOTE — Telephone Encounter (Signed)
I called the patient. The 2-D echocardiogram was normal. I will call her when I get the results of the cardiac event monitor.   2-D echocardiogram 02/28/2014:  Study Conclusions  - Left ventricle: The cavity size was normal. Wall thickness was normal. Systolic function was normal. The estimated ejection fraction was in the range of 55% to 60%. Wall motion was normal; there were no regional wall motion abnormalities. Left ventricular diastolic function parameters were normal. - Left atrium: The atrium was normal in size.  Impressions:  - Normal study.

## 2014-04-06 ENCOUNTER — Telehealth: Payer: Self-pay | Admitting: Neurology

## 2014-04-06 NOTE — Telephone Encounter (Signed)
I called the patient. The event monitor was unremarkable, but the patient had no episodes during the monitoring period. The day after she took off the monitor, she had an event. The patient will lie down, and she does not black out if she does this. She does not wish to go on medication such as Prozac. We may consider a referral to cardiology in the future.

## 2015-06-20 ENCOUNTER — Other Ambulatory Visit: Payer: Self-pay | Admitting: Obstetrics and Gynecology

## 2015-06-26 ENCOUNTER — Other Ambulatory Visit (HOSPITAL_COMMUNITY): Payer: Self-pay | Admitting: Obstetrics and Gynecology

## 2015-06-26 NOTE — H&P (Signed)
Deanna [name] is a 45 y.o.  P: 1-0-2-1 presents for  hysterectomy because of menorrhagia and symptomatic uterine fibroids.  For the past two years the patient has experienced mesntrual bleeding that has occurred different times each month, lasting 7 day with occasions of a flow that resembles flowing water.  She changes a super heavy flow pad, on average 3 times during the working day.  Her cramping is rated at 10/10 on a 10 point pain scale and has occurred without her menses.  Fortunately an Advil will relieve her pain.  She denies any dyspareunia, changes in bowel or bladder function and no vaginitis symptoms.  A pelvic ultrasound done 04/05/15 revealed: uterus 15.2 x 6.8 x 9.4 with #4 fibroids: 2.7 cm, 4.4 cm, 3.3 cm and 3.3 cm; her endometrium measured 0.3 cm;  a normal right ovary with the left ovary being  obscured by bowel gas. An endometrial biopsy done at that same time (at the [facility]) returned: no tumor or atypia.  She declined any medical treatment for her symptoms over the years  but has now, after a  review of both medical and surgical options,  decided to proceed with a supra-cervical hysterectomy.   Past Medical History  OB History: G:3;  P: 1-0-2-1;    SVB 1997  GYN History: menarche: 45 YO    LMP: 05/25/15    Contracepton condoms  The patient denies history of sexually transmitted disease.  Denies history of abnormal PAP smear.  Last PAP smear: 02/2015-normal  Medical History: Irritable Bowel Syndrome, Gastroesophageal Reflux Disease, HSV-1 and Seasonal Allergies  Surgical History: 1990 Laparoscopy for Pelvic Pain    2008 Left Breast Biopsy (benign) Denies problems with anesthesia or history of blood transfusions  Family History: Heart Disease, Cancer (breast, prostate and throat) and Hypertension  Social History:  Single and employed as a Merchant [employer] officer; Denies tobacco use and occasionally uses alcohol   Outpatient Encounter Prescriptions as of 06/26/2015  Medication Sig   . acyclovir (ZOVIRAX) 200 MG capsule Take 1 capsule (200 mg total) by mouth 5 (five) times daily. (Patient taking differently: Take 200 mg by mouth 5 (five) times daily as needed (for cold sores). )  . Ascorbic Acid (VITAMIN C PO) Take 1 tablet by mouth daily.  . cetirizine (ZYRTEC) 10 MG tablet Take 10 mg by mouth daily.  . ferrous sulfate (IRON SUPPLEMENT) 325 (65 FE) MG tablet Take 325 mg by mouth daily with breakfast.  . fluticasone (FLONASE) 50 MCG/ACT nasal spray Place 1 spray into both nostrils daily as needed for allergies or rhinitis.  Marland Kitchen ibuprofen (ADVIL,MOTRIN) 200 MG tablet Take 200 mg by mouth every 6 (six) hours as needed for pain.  . medroxyPROGESTERone (PROVERA) 5 MG tablet Take 10 mg by mouth daily.  . Multiple Vitamins-Minerals (ONE-A-DAY MENS VITACRAVES) CHEW Chew 1 tablet by mouth daily.  . naproxen sodium (ANAPROX) 220 MG tablet Take 220-440 mg by mouth 2 (two) times daily as needed (for pain).   . pantoprazole (PROTONIX) 40 MG tablet Take 40 mg by mouth daily.  Marland Kitchen VITAMIN E PO Take 1 capsule by mouth daily.   No facility-administered encounter medications on file as of 06/26/2015.    No Known Allergies  but sensitive to shellfish-itching  Denies sensitivity to peanuts, soy, latex or adhesives.   ROS: Admits to glasses, seasonal allergies and bilateral arm pain attributed to typing;  Denies headache, vision changes,  dysphagia, tinnitus, dizziness, hoarseness, cough,  chest pain, shortness of breath, nausea, vomiting,  diarrhea,constipation,  urinary frequency, urgency  dysuria, hematuria, vaginitis symptoms, pelvic pain, swelling of joints,easy bruising,  arthralgias, skin rashes, unexplained weight loss and except as is mentioned in the history of present illness, patient's review of systems is otherwise negative.    Physical Exam  Bp:  90/60    P:  68:  R: 20  Temperature: 98.6 degrees F    Weight:  161 lbs.   Height: 5' 8.5"  BMI: 24.1  Neck: supple without masses or  thyromegaly Lungs: clear to auscultation Heart: regular rate and rhythm Abdomen: firm,  non-tender mass from pelvis to 5 fingerbreadths above symphysis pubis and no organomegaly Pelvic:EGBUS- wnl; vagina-normal rugae; uterus-18 week size, cervix without lesions or motion tenderness; adnexae-no tenderness or masses Extremities:  no clubbing, cyanosis or edema   Assesment:  Symptomatic Uterine Fibroids            Menorrhagia   Disposition :Reviewed  with the patient the indications for her procedure(s) along with the risks of surgery to include, but not limited to: reaction to anesthesia, damage to adjacent organs, infection, excessive bleeding, possible bleeding from cervical stump and a 25% chance of a fibroid growing on the cervix. The patient verbalized understanding of these risks and has consented to proceed with a Supra-Cervical Hysterectomy with Bilateral Salpingectomy at [place] on July 09, 2015 at 7:30 a.m.     CSN# [id number]   Udell Blasingame J. Florene Glen, PA-C  for Dr. Seymour Bars. Haygood

## 2015-07-02 NOTE — Patient Instructions (Signed)
Your procedure is scheduled on:  Tuesday, July 09, 2015  Enter through the Main Entrance of [facility] at:  6:00 AM  Pick up the phone at the desk and dial [phone].  Call this number if you have problems the morning of surgery: [phone].  Remember:  Do NOT eat food or drink after: Midnight Monday  Take these medicines the morning of surgery with a SIP OF WATER:  Protonix  Do NOT wear jewelry (body piercing), metal hair clips/bobby pins, make-up, or nail polish. Do NOT wear lotions, powders, or perfumes.  You may wear deoderant. Do NOT shave for 48 hours prior to surgery. Do NOT bring valuables to the hospital. Contacts, dentures, or bridgework may not be worn into surgery.  Leave suitcase in car.  After surgery it may be brought to your room.  For patients admitted to the hospital, checkout time is 11:00 AM the day of discharge.

## 2015-07-03 ENCOUNTER — Encounter (HOSPITAL_COMMUNITY)
Admission: RE | Admit: 2015-07-03 | Discharge: 2015-07-03 | Disposition: A | Discharge status: Home / Self Care | Payer: BLUE CROSS/BLUE SHIELD | Source: Ambulatory Visit | Attending: Obstetrics and Gynecology | Admitting: Obstetrics and Gynecology

## 2015-07-03 ENCOUNTER — Encounter (HOSPITAL_COMMUNITY): Payer: Self-pay

## 2015-07-03 DIAGNOSIS — Z01812 Encounter for preprocedural laboratory examination: Secondary | ICD-10-CM | POA: Diagnosis not present

## 2015-07-03 HISTORY — DX: Gastro-esophageal reflux disease without esophagitis: K21.9

## 2015-07-03 LAB — TYPE AND SCREEN
ABO/RH(D): B POS
ANTIBODY SCREEN: NEGATIVE

## 2015-07-03 LAB — CBC
HCT: 37.1 % (ref 36.0–46.0)
Hemoglobin: 12.1 g/dL (ref 12.0–15.0)
MCH: 28.2 pg (ref 26.0–34.0)
MCHC: 32.6 g/dL (ref 30.0–36.0)
MCV: 86.5 fL (ref 78.0–100.0)
PLATELETS: 214 10*3/uL (ref 150–400)
RBC: 4.29 MIL/uL (ref 3.87–5.11)
RDW: 13.3 % (ref 11.5–15.5)
WBC: 4.4 10*3/uL (ref 4.0–10.5)

## 2015-07-03 LAB — ABO/RH: ABO/RH(D): B POS

## 2015-07-08 MED ORDER — DEXTROSE 5 % IV SOLN
2.0000 g | INTRAVENOUS | Status: AC
  Administered 2015-07-09: 2 g via INTRAVENOUS
  Filled 2015-07-08: qty 2

## 2015-07-08 NOTE — Anesthesia Preprocedure Evaluation (Addendum)
Anesthesia Evaluation  Patient identified by MRN, date of birth, ID band Patient awake    Reviewed: Allergy & Precautions, NPO status , Patient's Chart, lab work & pertinent test results  Airway Mallampati: II  TM Distance: >3 FB Neck ROM: Full    Dental  (+) Dental Advisory Given   Pulmonary neg pulmonary ROS,    breath sounds clear to auscultation       Cardiovascular negative cardio ROS   Rhythm:Regular Rate:Normal     Neuro/Psych negative neurological ROS     GI/Hepatic Neg liver ROS, GERD  ,  Endo/Other  negative endocrine ROS  Renal/GU negative Renal ROS     Musculoskeletal negative musculoskeletal ROS (+)   Abdominal   Peds  Hematology negative hematology ROS (+)   Anesthesia Other Findings   Reproductive/Obstetrics                            Lab Results  Component Value Date   WBC 4.4 07/03/2015   HGB 12.1 07/03/2015   HCT 37.1 07/03/2015   MCV 86.5 07/03/2015   PLT 214 07/03/2015   Lab Results  Component Value Date   CREATININE 0.70 04/25/2013   BUN 10 04/25/2013   NA 138 04/25/2013   K 3.9 04/25/2013   CL 100 04/25/2013   CO2 26 04/25/2013    Anesthesia Physical Anesthesia Plan  ASA: I  Anesthesia Plan: General   Post-op Pain Management:    Induction: Intravenous  Airway Management Planned: Oral ETT  Additional Equipment:   Intra-op Plan:   Post-operative Plan: Extubation in OR  Informed Consent: I have reviewed the patients History and Physical, chart, labs and discussed the procedure including the risks, benefits and alternatives for the proposed anesthesia with the patient or authorized representative who has indicated his/her understanding and acceptance.   Dental advisory given  Plan Discussed with: CRNA  Anesthesia Plan Comments:        Anesthesia Quick Evaluation

## 2015-07-09 ENCOUNTER — Inpatient Hospital Stay (HOSPITAL_COMMUNITY): Payer: BLUE CROSS/BLUE SHIELD | Admitting: Anesthesiology

## 2015-07-09 ENCOUNTER — Inpatient Hospital Stay (HOSPITAL_COMMUNITY)
Admission: RE | Admit: 2015-07-09 | Discharge: 2015-07-10 | DRG: 743 | Disposition: A | Discharge status: Home / Self Care | Payer: BLUE CROSS/BLUE SHIELD | Source: Ambulatory Visit | Attending: Obstetrics and Gynecology | Admitting: Obstetrics and Gynecology

## 2015-07-09 ENCOUNTER — Encounter (HOSPITAL_COMMUNITY)
Admission: RE | Disposition: A | Discharge status: Home / Self Care | Payer: Self-pay | Source: Ambulatory Visit | Attending: Obstetrics and Gynecology

## 2015-07-09 ENCOUNTER — Encounter (HOSPITAL_COMMUNITY): Payer: Self-pay

## 2015-07-09 DIAGNOSIS — N946 Dysmenorrhea, unspecified: Secondary | ICD-10-CM | POA: Diagnosis present

## 2015-07-09 DIAGNOSIS — D62 Acute posthemorrhagic anemia: Secondary | ICD-10-CM | POA: Diagnosis not present

## 2015-07-09 DIAGNOSIS — D649 Anemia, unspecified: Secondary | ICD-10-CM | POA: Diagnosis present

## 2015-07-09 DIAGNOSIS — D219 Benign neoplasm of connective and other soft tissue, unspecified: Secondary | ICD-10-CM | POA: Diagnosis present

## 2015-07-09 DIAGNOSIS — N920 Excessive and frequent menstruation with regular cycle: Secondary | ICD-10-CM | POA: Diagnosis present

## 2015-07-09 DIAGNOSIS — D251 Intramural leiomyoma of uterus: Secondary | ICD-10-CM

## 2015-07-09 DIAGNOSIS — D259 Leiomyoma of uterus, unspecified: Secondary | ICD-10-CM | POA: Diagnosis present

## 2015-07-09 HISTORY — PX: SUPRACERVICAL ABDOMINAL HYSTERECTOMY: SHX5393

## 2015-07-09 LAB — PREGNANCY, URINE: Preg Test, Ur: NEGATIVE

## 2015-07-09 SURGERY — HYSTERECTOMY, SUPRACERVICAL, ABDOMINAL
Anesthesia: General

## 2015-07-09 MED ORDER — GLYCOPYRROLATE 0.2 MG/ML IJ SOLN
INTRAMUSCULAR | Status: DC | PRN
  Administered 2015-07-09: .5 mg via INTRAVENOUS

## 2015-07-09 MED ORDER — PROPOFOL 10 MG/ML IV BOLUS
INTRAVENOUS | Status: AC
  Filled 2015-07-09: qty 20

## 2015-07-09 MED ORDER — MIDAZOLAM HCL 2 MG/2ML IJ SOLN
INTRAMUSCULAR | Status: AC
  Filled 2015-07-09: qty 2

## 2015-07-09 MED ORDER — PANTOPRAZOLE SODIUM 40 MG PO TBEC: 40.0000 mg | DELAYED_RELEASE_TABLET | Freq: Every day | ORAL | Status: DC

## 2015-07-09 MED ORDER — BUPIVACAINE HCL (PF) 0.25 % IJ SOLN
INTRAMUSCULAR | Status: AC
Start: 2015-07-09 — End: 2015-07-09
  Filled 2015-07-09: qty 30

## 2015-07-09 MED ORDER — MENTHOL 3 MG MT LOZG: 1.0000 | LOZENGE | OROMUCOSAL | Status: DC | PRN

## 2015-07-09 MED ORDER — ROCURONIUM BROMIDE 100 MG/10ML IV SOLN
INTRAVENOUS | Status: AC
  Filled 2015-07-09: qty 1

## 2015-07-09 MED ORDER — HYDROMORPHONE HCL 1 MG/ML IJ SOLN
INTRAMUSCULAR | Status: AC
  Administered 2015-07-09: 0.5 mg via INTRAVENOUS
  Filled 2015-07-09: qty 1

## 2015-07-09 MED ORDER — HYDROMORPHONE 1 MG/ML IV SOLN
INTRAVENOUS | Status: DC
  Administered 2015-07-09: 0.9 mg via INTRAVENOUS
  Administered 2015-07-09: 11:00:00 via INTRAVENOUS
  Filled 2015-07-09: qty 25

## 2015-07-09 MED ORDER — LACTATED RINGERS IV SOLN: INTRAVENOUS | Status: DC

## 2015-07-09 MED ORDER — KETOROLAC TROMETHAMINE 30 MG/ML IJ SOLN
30.0000 mg | Freq: Four times a day (QID) | INTRAMUSCULAR | Status: AC
  Administered 2015-07-09 – 2015-07-10 (×2): 30 mg via INTRAVENOUS
  Filled 2015-07-09 (×2): qty 1

## 2015-07-09 MED ORDER — FENTANYL CITRATE (PF) 100 MCG/2ML IJ SOLN
INTRAMUSCULAR | Status: DC | PRN
  Administered 2015-07-09: 100 ug via INTRAVENOUS
  Administered 2015-07-09: 50 ug via INTRAVENOUS
  Administered 2015-07-09 (×2): 100 ug via INTRAVENOUS

## 2015-07-09 MED ORDER — SODIUM CHLORIDE 0.9% FLUSH: 9.0000 mL | INTRAVENOUS | Status: DC | PRN

## 2015-07-09 MED ORDER — SCOPOLAMINE 1 MG/3DAYS TD PT72
1.0000 | MEDICATED_PATCH | Freq: Once | TRANSDERMAL | Status: DC
  Administered 2015-07-09: 1.5 mg via TRANSDERMAL

## 2015-07-09 MED ORDER — BUPIVACAINE HCL (PF) 0.25 % IJ SOLN
INTRAMUSCULAR | Status: DC | PRN
  Administered 2015-07-09: 30 mL

## 2015-07-09 MED ORDER — NALOXONE HCL 0.4 MG/ML IJ SOLN: 0.4000 mg | INTRAMUSCULAR | Status: DC | PRN

## 2015-07-09 MED ORDER — OXYCODONE HCL 5 MG PO TABS: 5.0000 mg | ORAL_TABLET | Freq: Once | ORAL | Status: DC | PRN

## 2015-07-09 MED ORDER — MIDAZOLAM HCL 2 MG/2ML IJ SOLN
INTRAMUSCULAR | Status: DC | PRN
  Administered 2015-07-09: 2 mg via INTRAVENOUS

## 2015-07-09 MED ORDER — FENTANYL CITRATE (PF) 250 MCG/5ML IJ SOLN
INTRAMUSCULAR | Status: AC
  Filled 2015-07-09: qty 5

## 2015-07-09 MED ORDER — LIDOCAINE HCL (CARDIAC) 20 MG/ML IV SOLN
INTRAVENOUS | Status: AC
  Filled 2015-07-09: qty 5

## 2015-07-09 MED ORDER — PROPOFOL 10 MG/ML IV BOLUS
INTRAVENOUS | Status: DC | PRN
  Administered 2015-07-09: 170 mg via INTRAVENOUS

## 2015-07-09 MED ORDER — DIPHENHYDRAMINE HCL 12.5 MG/5ML PO ELIX: 12.5000 mg | ORAL_SOLUTION | Freq: Four times a day (QID) | ORAL | Status: DC | PRN

## 2015-07-09 MED ORDER — KETOROLAC TROMETHAMINE 30 MG/ML IJ SOLN
INTRAMUSCULAR | Status: AC
  Filled 2015-07-09: qty 1

## 2015-07-09 MED ORDER — IBUPROFEN 600 MG PO TABS
600.0000 mg | ORAL_TABLET | Freq: Four times a day (QID) | ORAL | Status: DC | PRN
  Filled 2015-07-09: qty 1

## 2015-07-09 MED ORDER — LIDOCAINE HCL (CARDIAC) 20 MG/ML IV SOLN
INTRAVENOUS | Status: DC | PRN
  Administered 2015-07-09: 80 mg via INTRAVENOUS

## 2015-07-09 MED ORDER — KETOROLAC TROMETHAMINE 30 MG/ML IJ SOLN
INTRAMUSCULAR | Status: DC | PRN
  Administered 2015-07-09: 30 mg via INTRAVENOUS

## 2015-07-09 MED ORDER — NEOSTIGMINE METHYLSULFATE 10 MG/10ML IV SOLN
INTRAVENOUS | Status: DC | PRN
  Administered 2015-07-09: 3 mg via INTRAVENOUS

## 2015-07-09 MED ORDER — GLYCOPYRROLATE 0.2 MG/ML IJ SOLN
INTRAMUSCULAR | Status: AC
  Filled 2015-07-09: qty 3

## 2015-07-09 MED ORDER — PHENYLEPHRINE 40 MCG/ML (10ML) SYRINGE FOR IV PUSH (FOR BLOOD PRESSURE SUPPORT)
PREFILLED_SYRINGE | INTRAVENOUS | Status: AC
  Filled 2015-07-09: qty 10

## 2015-07-09 MED ORDER — PROMETHAZINE HCL 25 MG/ML IJ SOLN: 6.2500 mg | INTRAMUSCULAR | Status: DC | PRN

## 2015-07-09 MED ORDER — ONDANSETRON HCL 4 MG/2ML IJ SOLN: 4.0000 mg | Freq: Four times a day (QID) | INTRAMUSCULAR | Status: DC | PRN

## 2015-07-09 MED ORDER — KETOROLAC TROMETHAMINE 30 MG/ML IJ SOLN
30.0000 mg | Freq: Once | INTRAMUSCULAR | Status: DC
Start: 2015-07-09 — End: 2015-07-09

## 2015-07-09 MED ORDER — SCOPOLAMINE 1 MG/3DAYS TD PT72
MEDICATED_PATCH | TRANSDERMAL | Status: AC
  Administered 2015-07-09: 1.5 mg via TRANSDERMAL
  Filled 2015-07-09: qty 1

## 2015-07-09 MED ORDER — ONDANSETRON HCL 4 MG/2ML IJ SOLN
INTRAMUSCULAR | Status: AC
  Filled 2015-07-09: qty 2

## 2015-07-09 MED ORDER — OXYCODONE-ACETAMINOPHEN 5-325 MG PO TABS
1.0000 | ORAL_TABLET | ORAL | Status: DC | PRN
  Administered 2015-07-09: 2 via ORAL
  Filled 2015-07-09: qty 2

## 2015-07-09 MED ORDER — ONDANSETRON HCL 4 MG PO TABS: 4.0000 mg | ORAL_TABLET | Freq: Three times a day (TID) | ORAL | Status: DC | PRN

## 2015-07-09 MED ORDER — OXYCODONE HCL 5 MG/5ML PO SOLN: 5.0000 mg | Freq: Once | ORAL | Status: DC | PRN

## 2015-07-09 MED ORDER — ONDANSETRON HCL 4 MG/2ML IJ SOLN
INTRAMUSCULAR | Status: DC | PRN
  Administered 2015-07-09: 4 mg via INTRAVENOUS

## 2015-07-09 MED ORDER — DEXAMETHASONE SODIUM PHOSPHATE 10 MG/ML IJ SOLN
INTRAMUSCULAR | Status: DC | PRN
  Administered 2015-07-09: 4 mg via INTRAVENOUS

## 2015-07-09 MED ORDER — LACTATED RINGERS IV SOLN
INTRAVENOUS | Status: DC
  Administered 2015-07-09 (×3): via INTRAVENOUS

## 2015-07-09 MED ORDER — ROCURONIUM BROMIDE 100 MG/10ML IV SOLN
INTRAVENOUS | Status: DC | PRN
  Administered 2015-07-09: 10 mg via INTRAVENOUS
  Administered 2015-07-09: 40 mg via INTRAVENOUS
  Administered 2015-07-09: 5 mg via INTRAVENOUS

## 2015-07-09 MED ORDER — DIPHENHYDRAMINE HCL 50 MG/ML IJ SOLN: 12.5000 mg | Freq: Four times a day (QID) | INTRAMUSCULAR | Status: DC | PRN

## 2015-07-09 MED ORDER — NEOSTIGMINE METHYLSULFATE 10 MG/10ML IV SOLN
INTRAVENOUS | Status: AC
  Filled 2015-07-09: qty 1

## 2015-07-09 MED ORDER — DOCUSATE SODIUM 100 MG PO CAPS: 100.0000 mg | ORAL_CAPSULE | Freq: Two times a day (BID) | ORAL | Status: DC

## 2015-07-09 MED ORDER — HYDROMORPHONE HCL 1 MG/ML IJ SOLN
0.2500 mg | INTRAMUSCULAR | Status: DC | PRN
  Administered 2015-07-09 (×2): 0.5 mg via INTRAVENOUS

## 2015-07-09 MED ORDER — SUCCINYLCHOLINE CHLORIDE 20 MG/ML IJ SOLN
INTRAMUSCULAR | Status: AC
Start: 2015-07-09 — End: 2015-07-09
  Filled 2015-07-09: qty 1

## 2015-07-09 MED ORDER — DEXAMETHASONE SODIUM PHOSPHATE 4 MG/ML IJ SOLN
INTRAMUSCULAR | Status: AC
  Filled 2015-07-09: qty 1

## 2015-07-09 SURGICAL SUPPLY — 47 items
CANISTER SUCT 3000ML (MISCELLANEOUS) ×2 IMPLANT
CATH ROBINSON RED A/P 16FR (CATHETERS) IMPLANT
CLOTH BEACON ORANGE TIMEOUT ST (SAFETY) ×2 IMPLANT
CONT PATH 16OZ SNAP LID 3702 (MISCELLANEOUS) ×2 IMPLANT
CONT SPEC 4OZ CLIKSEAL STRL BL (MISCELLANEOUS) ×2 IMPLANT
CONT SPECI 4OZ STER CLIK (MISCELLANEOUS) IMPLANT
DECANTER SPIKE VIAL GLASS SM (MISCELLANEOUS) ×2 IMPLANT
DRAIN JACKSON PRT FLT 7MM (DRAIN) IMPLANT
DRAPE WARM FLUID 44X44 (DRAPE) ×2 IMPLANT
DRSG OPSITE POSTOP 4X10 (GAUZE/BANDAGES/DRESSINGS) ×2 IMPLANT
DURAPREP 26ML APPLICATOR (WOUND CARE) ×2 IMPLANT
ELECT CAUTERY BLADE 6.4 (BLADE) IMPLANT
ELECT NEEDLE TIP 2.8 STRL (NEEDLE) IMPLANT
EVACUATOR SILICONE 100CC (DRAIN) IMPLANT
GAUZE SPONGE 4X4 16PLY XRAY LF (GAUZE/BANDAGES/DRESSINGS) ×2 IMPLANT
GLOVE BIOGEL PI IND STRL 7.0 (GLOVE) ×1 IMPLANT
GLOVE BIOGEL PI INDICATOR 7.0 (GLOVE) ×1
GLOVE SURG SS PI 6.5 STRL IVOR (GLOVE) ×4 IMPLANT
GOWN STRL REUS W/TWL LRG LVL3 (GOWN DISPOSABLE) ×6 IMPLANT
LIQUID BAND (GAUZE/BANDAGES/DRESSINGS) ×2 IMPLANT
NEEDLE HYPO 22GX1.5 SAFETY (NEEDLE) IMPLANT
NEEDLE SPNL 22GX3.5 QUINCKE BK (NEEDLE) ×2 IMPLANT
NS IRRIG 1000ML POUR BTL (IV SOLUTION) ×4 IMPLANT
PACK ABDOMINAL GYN (CUSTOM PROCEDURE TRAY) ×2 IMPLANT
PAD OB MATERNITY 4.3X12.25 (PERSONAL CARE ITEMS) ×2 IMPLANT
SPONGE LAP 18X18 X RAY DECT (DISPOSABLE) ×4 IMPLANT
STAPLER VISISTAT 35W (STAPLE) IMPLANT
SUT MNCRL AB 3-0 PS2 27 (SUTURE) ×2 IMPLANT
SUT PDS AB 1 CT  36 (SUTURE)
SUT PDS AB 1 CT 36 (SUTURE) IMPLANT
SUT PLAIN 2 0 XLH (SUTURE) IMPLANT
SUT SILK 0 FSL (SUTURE) IMPLANT
SUT VIC AB 0 CT1 18XCR BRD8 (SUTURE) ×3 IMPLANT
SUT VIC AB 0 CT1 27 (SUTURE) ×2
SUT VIC AB 0 CT1 27XBRD ANBCTR (SUTURE) ×2 IMPLANT
SUT VIC AB 0 CT1 8-18 (SUTURE) ×3
SUT VIC AB 2-0 CT1 (SUTURE) ×2 IMPLANT
SUT VIC AB 3-0 SH 27 (SUTURE)
SUT VIC AB 3-0 SH 27X BRD (SUTURE) IMPLANT
SUT VICRYL 0 TIES 12 18 (SUTURE) ×2 IMPLANT
SYR 50ML LL SCALE MARK (SYRINGE) IMPLANT
SYR BULB IRRIGATION 50ML (SYRINGE) ×2 IMPLANT
SYR CONTROL 10ML LL (SYRINGE) ×2 IMPLANT
SYR TB 1ML 25GX5/8 (SYRINGE) IMPLANT
TOWEL OR 17X24 6PK STRL BLUE (TOWEL DISPOSABLE) ×4 IMPLANT
TRAY FOLEY CATH SILVER 14FR (SET/KITS/TRAYS/PACK) ×2 IMPLANT
WATER STERILE IRR 1000ML POUR (IV SOLUTION) ×2 IMPLANT

## 2015-07-09 NOTE — Anesthesia Procedure Notes (Signed)
Procedure Name: Intubation Date/Time: 07/09/2015 7:36 AM Performed by: Casimer Lanius A Pre-anesthesia Checklist: Patient identified, Patient being monitored, Timeout performed, Emergency Drugs available and Suction available Patient Re-evaluated:Patient Re-evaluated prior to inductionOxygen Delivery Method: Circle System Utilized Preoxygenation: Pre-oxygenation with 100% oxygen Intubation Type: IV induction Ventilation: Mask ventilation without difficulty Laryngoscope Size: Mac and 3 Grade View: Grade II Tube type: Oral Tube size: 7.0 mm Number of attempts: 1 Airway Equipment and Method: stylet Placement Confirmation: ETT inserted through vocal cords under direct vision,  positive ETCO2 and breath sounds checked- equal and bilateral Secured at: 21 cm Tube secured with: Tape Dental Injury: Teeth and Oropharynx as per pre-operative assessment

## 2015-07-09 NOTE — Progress Notes (Signed)
Day of Surgery Procedure(s) (LRB): HYSTERECTOMY SUPRACERVICAL ABDOMINAL with Bilateral Salpingectomy (N/A)  Subjective: Patient reports nausea, no vomiting, Incisional pain controlled with PCA, and tolerating PO fluids  Objective: I have reviewed patient's vital signs, intake and output and medications.  General: alert, cooperative, appears stated age and no distress Resp: clear to auscultation bilaterally Cardio: regular rate and rhythm, S1, S2 normal, no murmur, click, rub or gallop GI: soft, non-tender; bowel sounds normal; no masses,  no organomegaly.  Dressing dry Extremities: extremities normal, atraumatic, no cyanosis or edema Vaginal Bleeding: none  Assessment: s/p Procedure(s): HYSTERECTOMY SUPRACERVICAL ABDOMINAL with Bilateral Salpingectomy (N/A): stable  Plan: Advance diet Encourage ambulation Advance to PO medication Possible Discharge home in am  LOS: 0 days    Deanna [name] P 07/09/2015, 7:08 PM

## 2015-07-09 NOTE — Transfer of Care (Signed)
Immediate Anesthesia Transfer of Care Note  Patient: Deanna [name]  Procedure(s) Performed: Procedure(s): HYSTERECTOMY SUPRACERVICAL ABDOMINAL with Bilateral Salpingectomy (N/A)  Patient Location: PACU  Anesthesia Type:General  Level of Consciousness: awake  Airway & Oxygen Therapy: Patient Spontanous Breathing  Post-op Assessment: Report given to PACU RN  Post vital signs: stable  Filed Vitals:   07/09/15 0618  BP: 118/87  Pulse: 82  Temp: 36.8 C  Resp: 20    Complications: No apparent anesthesia complications

## 2015-07-09 NOTE — H&P (Signed)
History and Physical Interval Note:   07/09/2015   7:19 AM   Deanna [name]  has presented today for surgery, with the diagnosis of Uterine Fibroids, Menorrhagia, Anemia  The various methods of treatment have been discussed with the patient and family. After consideration of risks, benefits and other options for treatment, the patient has consented to  Procedure(s): HYSTERECTOMY SUPRACERVICAL ABDOMINAL Possible Bilateral Salpingectomy as a surgical intervention .  I have reviewed the patients' chart and labs.  Questions were answered to the patient's satisfaction.     Eldred Manges  MD

## 2015-07-09 NOTE — Addendum Note (Signed)
Addendum  created 07/09/15 1935 by Flossie Dibble, CRNA   Modules edited: Notes Section   Notes Section:  File: [id number]

## 2015-07-09 NOTE — Anesthesia Postprocedure Evaluation (Signed)
Anesthesia Post Note  Patient: Deanna [name]  Procedure(s) Performed: Procedure(s) (LRB): HYSTERECTOMY SUPRACERVICAL ABDOMINAL with Bilateral Salpingectomy (N/A)  Patient location during evaluation: [facility] Anesthesia Type: General Level of consciousness: awake and alert Pain management: satisfactory to patient Vital Signs Assessment: post-procedure vital signs reviewed and stable Respiratory status: spontaneous breathing and respiratory function stable Cardiovascular status: stable Postop Assessment: adequate PO intake Anesthetic complications: no    Last Vitals:  Filed Vitals:   07/09/15 1431 07/09/15 1640  BP: 119/74 110/48  Pulse: 74 70  Temp: 37.2 C   Resp: 13 15    Last Pain:  Filed Vitals:   07/09/15 1641  PainSc: 4                  Lux Skilton

## 2015-07-09 NOTE — Discharge Instructions (Signed)
Call [facility] @ [phone] if:  You have a temperature greater than or equal to 100.4 degrees Farenheit orally You have pain that is not made better by the pain medication given and taken as directed You have excessive bleeding or problems urinating  Take Colace (Docusate Sodium/Stool Softener) 100 mg 2-3 times daily while taking narcotic pain medicine to avoid constipation or until bowel movements are regular.  You may drive after 1 week You may walk up steps You may shower   You may resume a regular diet Keep incisions clean and dry; Remove your honeycomb dressing on March 27, 20117  Do not lift over 15 pounds for 6 weeks Avoid anything in vagina for 6 weeks (or until after your post-operative visit)

## 2015-07-09 NOTE — Anesthesia Postprocedure Evaluation (Signed)
Anesthesia Post Note  Patient: Deanna [name]  Procedure(s) Performed: Procedure(s) (LRB): HYSTERECTOMY SUPRACERVICAL ABDOMINAL with Bilateral Salpingectomy (N/A)  Patient location during evaluation: PACU Anesthesia Type: General Level of consciousness: awake and alert Pain management: pain level controlled Vital Signs Assessment: post-procedure vital signs reviewed and stable Respiratory status: spontaneous breathing, nonlabored ventilation, respiratory function stable and patient connected to nasal cannula oxygen Cardiovascular status: blood pressure returned to baseline and stable Postop Assessment: no signs of nausea or vomiting Anesthetic complications: no    Last Vitals:  Filed Vitals:   07/09/15 1100 07/09/15 1115  BP:  114/67  Pulse: 75 77  Temp:  36.7 C  Resp:  16    Last Pain:  Filed Vitals:   07/09/15 1125  PainSc: 4                  [name]

## 2015-07-09 NOTE — Op Note (Signed)
07/09/2015  9:41 AM  PATIENT:  Deanna [name]  45 y.o. female MRN:  [id number]  PRE-OPERATIVE DIAGNOSIS:  Uterine Fibroids, Menorrhagia, Anemia  POST-OPERATIVE DIAGNOSIS:  Uterine Fibroids, Menorrhagia, Anemia  PROCEDURE:  Procedure(s): HYSTERECTOMY SUPRACERVICAL ABDOMINAL with Bilateral Salpingectomy  SURGEON:  Surgeon(s): Eldred Manges, MD  ASSISTANTS: Earnstine Regal certified physician assistant   ANESTHESIA: Gen. endotracheal  ESTIMATED BLOOD LOSS: 123XX123 cc  COMPLICATIONS: None  BLOOD ADMINISTERED:none  DRAINS: Urinary Catheter (Foley)   LOCAL MEDICATIONS USED:  MARCAINE    and Amount: 30 ml  FINDINGS: The uterus was enlarged to approximately 16 weeks size with multiple myomata. The tubes and ovaries were normal bilaterally. On examination of the upper abdomen, there were no abnormalities noted.    SPECIMEN:  Source of Specimen:  Fundus of the uterus with left fallopian tube attached. Right fallopian tube  DISPOSITION OF SPECIMEN:  PATHOLOGY  COUNTS:  YES  PROCEDURE: The patient was taken to the operating room after appropriate identification and placed on the operating table in the supine position. Equipment for the induction of general anesthesia was placed and after the attainment of adequate general anesthesia the abdomen was prepped with ChloraPrep. The, perineum, and vagina were prepped with multiple layers of Betadine. a timeout was done.  A Foley catheter was inserted into the bladder under sterile conditions and connected to straight drainage. The abdomen was draped as a sterile field.  Suprapubic injection of 30 cc of quarter percent Marcaine was undertaken. A suprapubic incision was made and the abdomen opened in layers. Peritoneum was entered and a self-retaining retractor placed in the abdominal cavity. A bladder blade was placed. After visual and palpable intraperitoneal evaluation of the anatomy was carried out large Kelly clamps were placed at the cornual  regions of the uterus. The right round ligament was then identified clamped cut and suture ligated. That incision was taken anteriorly on the anterior leaf of the broad ligament. The utero-ovarian ligament was identified clamped cut, free tied and suture-ligated. A similar procedure was carried out on the opposite side with the round ligament and utero-ovarian ligament after the mesial salpinx was cauterized and cut up to the level of the cornual region on the left The bladder flap was completed on the opposite side with incision of the anterior leaf of the broad ligament. The bladder was dissected off the anterior cervix with a combination of blunt and sharp dissection. The uterine arteries on the right and left side were clamped cut and suture ligated.  Parametial tissues on the right and left side were clamped, cut and suture-ligated down to the level of the cervix. The uterine fundus was excised. The peritoneal serosa was oversewn over the cervical stump. The right fallopian tube was then elevated with a Babcock clamp and the mesial salpinx clamped with a Kelly, cut and suture-ligated. 2-0 Vicryl Copious irrigation was carried out. Marland Kitchen Hemostasis was noted to be adequate and all instruments were removed from the peritoneal cavity.  The abdominal peritoneum was closed using running suture of 2-0 Vicryl.  The rectus fascia was closed with running sutures of 0 Vicryl from each apex to  the midline and tied in the midline. The subcutaneous tissue was made hemostatic with Bovie cautery and irrigated. The skin incision was closed with a subcuticular suture of 3-0 Monocryl. Sterile dressing was applied after Dermabond. The patient was awakened from general anesthesia and taken to the recovery room in satisfactory condition having tolerated the procedure well with  sponge and instrument  counts correct.  PLAN OF CARE: Admission after postanesthesia care  PATIENT DISPOSITION:  PACU - hemodynamically stable.   Delay  start of Pharmacological VTE agent (>24hrs) due to surgical blood loss or risk of bleeding:  Yes.  SCD hose were used during the procedure and postoperatively.   Deanna [name] 9:41 AM

## 2015-07-10 ENCOUNTER — Encounter (HOSPITAL_COMMUNITY): Payer: Self-pay | Admitting: Obstetrics and Gynecology

## 2015-07-10 DIAGNOSIS — D62 Acute posthemorrhagic anemia: Secondary | ICD-10-CM | POA: Diagnosis not present

## 2015-07-10 LAB — CBC
HCT: 31.5 % — ABNORMAL LOW (ref 36.0–46.0)
HEMOGLOBIN: 10.2 g/dL — AB (ref 12.0–15.0)
MCH: 27.9 pg (ref 26.0–34.0)
MCHC: 32.4 g/dL (ref 30.0–36.0)
MCV: 86.3 fL (ref 78.0–100.0)
Platelets: 188 10*3/uL (ref 150–400)
RBC: 3.65 MIL/uL — AB (ref 3.87–5.11)
RDW: 13.4 % (ref 11.5–15.5)
WBC: 7.8 10*3/uL (ref 4.0–10.5)

## 2015-07-10 MED ORDER — IBUPROFEN 600 MG PO TABS: ORAL_TABLET | ORAL | Status: DC

## 2015-07-10 MED ORDER — ONDANSETRON HCL 4 MG PO TABS: 4.0000 mg | ORAL_TABLET | Freq: Three times a day (TID) | ORAL | Status: DC | PRN

## 2015-07-10 MED ORDER — OXYCODONE-ACETAMINOPHEN 5-325 MG PO TABS: 1.0000 | ORAL_TABLET | Freq: Four times a day (QID) | ORAL | Status: DC | PRN

## 2015-07-10 NOTE — Progress Notes (Signed)
Deanna [name] is a59 y.o.  [id number]  Post Op Date # 1:  Abdominal Supra-cervical Hysterectomy/Bilateral Salpingectomy  Subjective: Patient is Doing well postoperatively. Patient has minimal pain with Percocet and Toradol. Ambulating without dizziness, tolerating regular diet without nausea, passing flatus but hasn't voided yet.   Objective: Vital signs in last 24 hours: Temp:  [97.6 F (36.4 C)-99 F (37.2 C)] 98.6 F (37 C) (03/22 0616) Pulse Rate:  [60-84] 60 (03/22 0616) Resp:  [13-19] 16 (03/22 0616) BP: (94-123)/(48-80) 104/55 mmHg (03/22 0616) SpO2:  [97 %-100 %] 97 % (03/22 0616) Weight:  [158 lb (71.668 kg)] 158 lb (71.668 kg) (03/21 1115)  Intake/Output from previous day: 03/21 0701 - 03/22 0700 In: 4092.5 [P.O.:1770; I.V.:2322.5] Out: 4675 [Urine:4575] Intake/Output this shift:    Recent Labs Lab 07/03/15 1330 07/10/15 0535  WBC 4.4 7.8  HGB 12.1 10.2*  HCT 37.1 31.5*  PLT 214 188     EXAM: General: alert, cooperative and no distress Resp: clear to auscultation bilaterally Cardio: regular rate and rhythm, S1, S2 normal, no murmur, click, rub or gallop GI: Bowel sounds present with dressing clean/dry/intact and no evidence of infection seen through honeycomb window. Extremities: Homans sign is negative, no sign of DVT and no calf tenderness.  No vaginal bleeding  Assessment: s/p Procedure(s): HYSTERECTOMY SUPRACERVICAL ABDOMINAL with Bilateral Salpingectomy: stable, progressing well, tolerating diet and anemia  Plan: Routine care  Continue ambulation Discharge after void  LOS: 1 day    POWELL,ELMIRA, PA-C 07/10/2015 7:38 AM

## 2015-07-10 NOTE — Discharge Summary (Signed)
Physician Discharge Summary  Patient ID: Deanna [name] MRN: [id number] DOB/AGE: 12/13/1970 45 y.o.  Admit date: 07/09/2015 Discharge date: 07/10/2015   Discharge Diagnoses:  Symptomatic Uterine Fibroids and Menorrhagia Active Problems:   Fibroids   Dysmenorrhea   Menorrhagia   Operation: Abdominal Supra-cervical Hysterectomy with Bilateral Salpingectomy   Discharged Condition: Good   Hospital Course: On the date of admission the patient underwent the aforementioned procedures and tolerated them well.  Post operative course was unremarkable with the patient resuming bowel and bladder function by post operative day #1 and tolerating a hemoglobin of 10.2.  Having received the maximum benefit of her hospital stay the patient was deemed ready for discharge home.  Disposition: 01-Home or Self Care  Discharge Medications:    Medication List    STOP taking these medications        medroxyPROGESTERone 5 MG tablet  Commonly known as:  PROVERA     naproxen sodium 220 MG tablet  Commonly known as:  ANAPROX      TAKE these medications        acyclovir 200 MG capsule  Commonly known as:  ZOVIRAX  Take 1 capsule (200 mg total) by mouth 5 (five) times daily.     cetirizine 10 MG tablet  Commonly known as:  ZYRTEC  Take 10 mg by mouth daily.     fluticasone 50 MCG/ACT nasal spray  Commonly known as:  FLONASE  Place 1 spray into both nostrils daily as needed for allergies or rhinitis.     ibuprofen 600 MG tablet  Commonly known as:  ADVIL,MOTRIN  1  po  pc  every 6 hours for 5 days then as needed for pain     IRON SUPPLEMENT 325 (65 FE) MG tablet  Generic drug:  ferrous sulfate  Take 325 mg by mouth daily with breakfast.     ondansetron 4 MG tablet  Commonly known as:  ZOFRAN  Take 1 tablet (4 mg total) by mouth every 8 (eight) hours as needed for nausea or vomiting.     ONE-A-DAY MENS VITACRAVES Chew  Chew 1 tablet by mouth daily.     oxyCODONE-acetaminophen 5-325 MG  tablet  Commonly known as:  PERCOCET/ROXICET  Take 1-2 tablets by mouth every 6 (six) hours as needed for severe pain (moderate to severe pain (when tolerating fluids)).     pantoprazole 40 MG tablet  Commonly known as:  PROTONIX  Take 40 mg by mouth daily.     VITAMIN C PO  Take 1 tablet by mouth daily.     VITAMIN E PO  Take 1 capsule by mouth daily.        Oxycodone-acetaminophen 5-325 mg 1-2  po every 6 hours prn Ibuprofen 600 mg 1  po pc every 6 hours for 5 days then as needed for pain Ondansetron 4 mg 1 po every 8 hours as needed for nausea  Follow-up: Dr. Lorriane Shire P.  Haygood on August 09, 2015 at 10: 45 a.m.   SignedEarnstine Regal,  PA-C 07/10/2015, 8:22 AM

## 2015-07-15 ENCOUNTER — Encounter: Payer: Self-pay | Admitting: Obstetrics & Gynecology

## 2015-08-28 DIAGNOSIS — N898 Other specified noninflammatory disorders of vagina: Secondary | ICD-10-CM | POA: Diagnosis not present

## 2015-11-12 ENCOUNTER — Ambulatory Visit (INDEPENDENT_AMBULATORY_CARE_PROVIDER_SITE_OTHER): Payer: BLUE CROSS/BLUE SHIELD | Admitting: Physician Assistant

## 2015-11-12 ENCOUNTER — Telehealth: Payer: Self-pay

## 2015-11-12 ENCOUNTER — Ambulatory Visit (HOSPITAL_COMMUNITY)
Admission: RE | Admit: 2015-11-12 | Discharge: 2015-11-12 | Disposition: A | Discharge status: Home / Self Care | Payer: BLUE CROSS/BLUE SHIELD | Source: Ambulatory Visit | Attending: Physician Assistant | Admitting: Physician Assistant

## 2015-11-12 VITALS — BP 126/80 | HR 77 | Temp 98.4°F | Resp 16 | Ht 69.0 in | Wt 160.0 lb

## 2015-11-12 DIAGNOSIS — R1031 Right lower quadrant pain: Secondary | ICD-10-CM

## 2015-11-12 DIAGNOSIS — B373 Candidiasis of vulva and vagina: Secondary | ICD-10-CM

## 2015-11-12 DIAGNOSIS — N281 Cyst of kidney, acquired: Secondary | ICD-10-CM | POA: Insufficient documentation

## 2015-11-12 DIAGNOSIS — G8929 Other chronic pain: Secondary | ICD-10-CM | POA: Diagnosis not present

## 2015-11-12 DIAGNOSIS — R938 Abnormal findings on diagnostic imaging of other specified body structures: Secondary | ICD-10-CM | POA: Insufficient documentation

## 2015-11-12 DIAGNOSIS — K7689 Other specified diseases of liver: Secondary | ICD-10-CM | POA: Diagnosis not present

## 2015-11-12 DIAGNOSIS — B3731 Acute candidiasis of vulva and vagina: Secondary | ICD-10-CM

## 2015-11-12 DIAGNOSIS — R319 Hematuria, unspecified: Secondary | ICD-10-CM | POA: Diagnosis not present

## 2015-11-12 LAB — POCT URINALYSIS DIP (MANUAL ENTRY)
BILIRUBIN UA: NEGATIVE
Glucose, UA: NEGATIVE
Ketones, POC UA: NEGATIVE
LEUKOCYTES UA: NEGATIVE
Nitrite, UA: NEGATIVE
Protein Ur, POC: NEGATIVE
Urobilinogen, UA: 0.2
pH, UA: 6.5

## 2015-11-12 LAB — POCT CBC
Granulocyte percent: 66.4 %G (ref 37–80)
HCT, POC: 37.7 % (ref 37.7–47.9)
Hemoglobin: 13.5 g/dL (ref 12.2–16.2)
LYMPH, POC: 1.3 (ref 0.6–3.4)
MCH: 29.4 pg (ref 27–31.2)
MCHC: 35.8 g/dL — AB (ref 31.8–35.4)
MCV: 82.3 fL (ref 80–97)
MID (CBC): 0.1 (ref 0–0.9)
MPV: 8.2 fL (ref 0–99.8)
POC Granulocyte: 2.9 (ref 2–6.9)
POC LYMPH PERCENT: 30.5 %L (ref 10–50)
POC MID %: 3.1 % (ref 0–12)
Platelet Count, POC: 206 10*3/uL (ref 142–424)
RBC: 4.58 M/uL (ref 4.04–5.48)
RDW, POC: 13.3 %
WBC: 4.3 10*3/uL — AB (ref 4.6–10.2)

## 2015-11-12 LAB — POCT WET + KOH PREP: TRICH BY WET PREP: ABSENT

## 2015-11-12 LAB — COMPLETE METABOLIC PANEL WITH GFR
ALT: 9 U/L (ref 6–29)
AST: 13 U/L (ref 10–35)
Albumin: 4.6 g/dL (ref 3.6–5.1)
Alkaline Phosphatase: 62 U/L (ref 33–115)
BUN: 8 mg/dL (ref 7–25)
CALCIUM: 9.3 mg/dL (ref 8.6–10.2)
CHLORIDE: 104 mmol/L (ref 98–110)
CO2: 24 mmol/L (ref 20–31)
Creat: 0.8 mg/dL (ref 0.50–1.10)
GFR, EST NON AFRICAN AMERICAN: 89 mL/min (ref >=60)
Glucose, Bld: 92 mg/dL (ref 65–99)
POTASSIUM: 4.5 mmol/L (ref 3.5–5.3)
Sodium: 139 mmol/L (ref 135–146)
Total Bilirubin: 0.7 mg/dL (ref 0.2–1.2)
Total Protein: 7.4 g/dL (ref 6.1–8.1)

## 2015-11-12 LAB — POC MICROSCOPIC URINALYSIS (UMFC): Mucus: ABSENT

## 2015-11-12 MED ORDER — FLUCONAZOLE 150 MG PO TABS: 150.0000 mg | ORAL_TABLET | Freq: Once | ORAL | 0 refills | Status: AC

## 2015-11-12 NOTE — Patient Instructions (Addendum)
Please report to [facility] admitting at 1:45 to be in Radiology by 2:00 today.  Nothing to eat or drink before appointment time.    IF you received an x-ray today, you will receive an invoice from [facility] Radiology. Please contact [facility] Radiology at [phone] with questions or concerns regarding your invoice.   IF you received labwork today, you will receive an invoice from [employer]. Please contact [facility] at [phone] with questions or concerns regarding your invoice.   Our billing staff will not be able to assist you with questions regarding bills from these companies.  You will be contacted with the lab results as soon as they are available. The fastest way to get your results is to activate your My Chart account. Instructions are located on the last page of this paperwork. If you have not heard from [place] regarding the results in 2 weeks, please contact this office.

## 2015-11-12 NOTE — Telephone Encounter (Signed)
Pt is wanting to have the murilex called in the pharmacy states this would be cheaper-please no generic

## 2015-11-12 NOTE — Progress Notes (Signed)
[facility] [address] [place] [address] [phone]  Date:  11/12/2015   Name:  Deanna [name]   DOB:  08-02-1970   MRN:  [id number]  PCP:  No primary care provider on file.    History of Present Illness:  Deanna [name] is a 45 y.o. female patient who presents to [facility] for cc of lower abdominal pian.     Started this morning.  Pain at the right side--throbbing pain 7/10--pain is now a 4/10.  Nausea accompanied the pain, where could not stand or sit. After she layed down.  The nausea has since resolved.  She has some dizziness.  No fever.   She takes miralax for constipation.  No blood in the stool or black stool.  She also consumes prunes, as she feels like she can not pass much though she is having bowel movements daily.  Prunes.  No body aches or chills.  No hematuria, no frequency, no dysuria.   Woke up with nosebleed.  And allergies.   She also reports a spider bite a few days ago, while she was travelling in [place].  She was in [place].     Patient Active Problem List   Diagnosis Date Noted  . Postoperative anemia due to acute blood loss 07/10/2015  . Menorrhagia 07/09/2015  . Syncope 02/21/2014  . Near syncope 02/21/2014  . Fibroids 02/09/2012  . Dysmenorrhea 02/09/2012  . HSV-1 infection     Past Medical History:  Diagnosis Date  . Allergy   . BV (bacterial vaginosis)   . GERD (gastroesophageal reflux disease)   . HSV-1 infection   . IBS (irritable bowel syndrome)   . Near syncope 02/21/2014  . Ovarian cyst   . Syncope 02/21/2014  . Yeast infection     Past Surgical History:  Procedure Laterality Date  . BREAST BIOPSY  2008  . LAPAROSCOPY  1991   no endometriosis found  . SUPRACERVICAL ABDOMINAL HYSTERECTOMY N/A 07/09/2015   Procedure: HYSTERECTOMY SUPRACERVICAL ABDOMINAL with Bilateral Salpingectomy;  Surgeon: Eldred Manges, MD;  Location: [facility];  Service: Gynecology;  Laterality: N/A;    Social History  Substance Use  Topics  . Smoking status: Never Smoker  . Smokeless tobacco: Never Used  . Alcohol use 0.0 oz/week     Comment: wine occassional    Family History  Problem Relation Age of Onset  . Heart disease Maternal Grandmother   . Hypertension Maternal Grandmother   . Sickle cell trait Cousin   . Cancer Maternal Aunt   . Anemia Mother   . Throat cancer Paternal Uncle   . Cancer Paternal Uncle     unsure ?     No Known Allergies  Medication list has been reviewed and updated.  Current Outpatient Prescriptions on File Prior to Visit  Medication Sig Dispense Refill  . acyclovir (ZOVIRAX) 200 MG capsule Take 1 capsule (200 mg total) by mouth 5 (five) times daily. (Patient taking differently: Take 200 mg by mouth 5 (five) times daily as needed (for cold sores). ) 30 capsule 12  . Ascorbic Acid (VITAMIN C PO) Take 1 tablet by mouth daily.    . cetirizine (ZYRTEC) 10 MG tablet Take 10 mg by mouth daily.    . ferrous sulfate (IRON SUPPLEMENT) 325 (65 FE) MG tablet Take 325 mg by mouth daily with breakfast.    . fluticasone (FLONASE) 50 MCG/ACT nasal spray Place 1 spray into both nostrils daily as needed for allergies or  rhinitis.    Marland Kitchen ibuprofen (ADVIL,MOTRIN) 600 MG tablet 1  po  pc  every 6 hours for 5 days then as needed for pain 30 tablet 1  . Multiple Vitamins-Minerals (ONE-A-DAY MENS VITACRAVES) CHEW Chew 1 tablet by mouth daily.    . pantoprazole (PROTONIX) 40 MG tablet Take 40 mg by mouth daily.    Marland Kitchen VITAMIN E PO Take 1 capsule by mouth daily.     No current facility-administered medications on file prior to visit.     ROS ROS otherwise unremarkable unless listed above.   Physical Examination: BP 126/80   Pulse 77   Temp 98.4 F (36.9 C) (Oral)   Resp 16   Ht 5\' 9"  (1.753 m)   Wt 160 lb (72.6 kg)   SpO2 100%   BMI 23.63 kg/m  Ideal Body Weight: Weight in (lb) to have BMI = 25: 168.9  Physical Exam  Constitutional: She is oriented to person, place, and time. She appears  well-developed and well-nourished. No distress.  HENT:  Head: Normocephalic and atraumatic.  Right Ear: Tympanic membrane, external ear and ear canal normal.  Left Ear: Tympanic membrane, external ear and ear canal normal.  Nose: Right sinus exhibits no maxillary sinus tenderness and no frontal sinus tenderness. Left sinus exhibits no maxillary sinus tenderness and no frontal sinus tenderness.  Mouth/Throat: Oropharynx is clear and moist. No uvula swelling. No oropharyngeal exudate, posterior oropharyngeal edema or posterior oropharyngeal erythema.  Eyes: Conjunctivae and EOM are normal. Pupils are equal, round, and reactive to light.  Neck: Normal range of motion. Neck supple. No thyromegaly present.  Cardiovascular: Normal rate, regular rhythm, normal heart sounds and intact distal pulses.  Exam reveals no gallop, no distant heart sounds and no friction rub.   No murmur heard. Pulmonary/Chest: Effort normal and breath sounds normal. No respiratory distress. She has no decreased breath sounds. She has no wheezes. She has no rhonchi.  Abdominal: Soft. Bowel sounds are normal. She exhibits no distension and no mass. There is no hepatosplenomegaly. There is tenderness in the right upper quadrant. There is no tenderness at McBurney's point and negative Murphy's sign.  Genitourinary: Vagina normal. Pelvic exam was performed with patient supine. There is no rash on the right labia. There is no rash on the left labia. Cervix exhibits discharge (white and curd-like). Right adnexum displays no mass. Left adnexum displays no mass.  Musculoskeletal: Normal range of motion. She exhibits no edema or tenderness.  Lymphadenopathy:       Head (right side): No submandibular, no tonsillar, no preauricular and no posterior auricular adenopathy present.       Head (left side): No submandibular, no tonsillar, no preauricular and no posterior auricular adenopathy present.    She has no cervical adenopathy.   Neurological: She is alert and oriented to person, place, and time. No cranial nerve deficit. She exhibits normal muscle tone. Coordination normal.  Skin: Skin is warm and dry. She is not diaphoretic.  Psychiatric: She has a normal mood and affect. Her behavior is normal.   Results for orders placed or performed in visit on 11/12/15  POCT urinalysis dipstick  Result Value Ref Range   Color, UA yellow yellow   Clarity, UA clear clear   Glucose, UA negative negative   Bilirubin, UA negative negative   Ketones, POC UA negative negative   Spec Grav, UA <=1.005    Blood, UA trace-intact (A) negative   pH, UA 6.5    Protein Ur, POC  negative negative   Urobilinogen, UA 0.2    Nitrite, UA Negative Negative   Leukocytes, UA Negative Negative  POCT Microscopic Urinalysis ([facility])  Result Value Ref Range   WBC,UR,HPF,POC None None WBC/hpf   RBC,UR,HPF,POC None None RBC/hpf   Bacteria None None, Too numerous to count   Mucus Absent Absent   Epithelial Cells, UR Per Microscopy Few (A) None, Too numerous to count cells/hpf  POCT CBC  Result Value Ref Range   WBC 4.3 (A) 4.6 - 10.2 K/uL   Lymph, poc 1.3 0.6 - 3.4   POC LYMPH PERCENT 30.5 10 - 50 %L   MID (cbc) 0.1 0 - 0.9   POC MID % 3.1 0 - 12 %M   POC Granulocyte 2.9 2 - 6.9   Granulocyte percent 66.4 37 - 80 %G   RBC 4.58 4.04 - 5.48 M/uL   Hemoglobin 13.5 12.2 - 16.2 g/dL   HCT, POC 37.7 37.7 - 47.9 %   MCV 82.3 80 - 97 fL   MCH, POC 29.4 27 - 31.2 pg   MCHC 35.8 (A) 31.8 - 35.4 g/dL   RDW, POC 13.3 %   Platelet Count, POC 206 142 - 424 K/uL   MPV 8.2 0 - 99.8 fL  POCT Wet + KOH Prep  Result Value Ref Range   Yeast by KOH Present Present, Absent   Yeast by wet prep Present Present, Absent   WBC by wet prep Few None, Few, Too numerous to count   Clue Cells Wet Prep HPF POC Moderate (A) None, Too numerous to count   Trich by wet prep Absent Present, Absent   Bacteria Wet Prep HPF POC Many (A) None, Few, Too numerous to count    Epithelial Cells By [employer] ([facility]) None None, Few, Too numerous to count   RBC,UR,HPF,POC None None RBC/hpf   Assessment and Plan: Deanna [name] is a 45 y.o. female who is here today for cc of abdominal pain. Patient wishes to have abdominal ct which we will perform at this time.    Abdominal pain, chronic, right lower quadrant - Plan: POCT urinalysis dipstick, POCT Microscopic Urinalysis ([facility]), POCT CBC, POCT Wet + KOH Prep, COMPLETE METABOLIC PANEL WITH GFR, CT Abdomen Pelvis Wo Contrast  Hematuria - Plan: COMPLETE METABOLIC PANEL WITH GFR, CT Abdomen Pelvis Wo Contrast  Vaginal candidiasis - Plan: fluconazole (DIFLUCAN) 150 MG tablet   Ivar Drape, PA-C [facility] 11/12/2015 11:06 AM  CT performed with cyst seen.  I have advised her to take the diflucan and we will also do miralax twice per day for no more than one week.  Advised to return in 4 days.

## 2015-11-15 ENCOUNTER — Telehealth: Payer: Self-pay

## 2015-11-15 DIAGNOSIS — R109 Unspecified abdominal pain: Secondary | ICD-10-CM

## 2015-11-15 MED ORDER — POLYETHYLENE GLYCOL 3350 17 GM/SCOOP PO POWD: 17.0000 g | Freq: Two times a day (BID) | ORAL | 1 refills | Status: DC | PRN

## 2015-11-15 NOTE — Telephone Encounter (Signed)
Please advise that her kidney and liver function are normal.  These do not appear to contribute to her symptoms.  All labs appear normal.  Please ask how she is feeling?  Is she doing the miralax increased?

## 2015-11-15 NOTE — Telephone Encounter (Signed)
Order placed to pharmacy.

## 2016-01-22 DIAGNOSIS — N83299 Other ovarian cyst, unspecified side: Secondary | ICD-10-CM | POA: Diagnosis not present

## 2016-01-22 DIAGNOSIS — Z90710 Acquired absence of both cervix and uterus: Secondary | ICD-10-CM | POA: Diagnosis not present

## 2016-02-11 DIAGNOSIS — J309 Allergic rhinitis, unspecified: Secondary | ICD-10-CM | POA: Diagnosis not present

## 2016-02-11 DIAGNOSIS — K589 Irritable bowel syndrome without diarrhea: Secondary | ICD-10-CM | POA: Diagnosis not present

## 2016-02-11 DIAGNOSIS — Z1231 Encounter for screening mammogram for malignant neoplasm of breast: Secondary | ICD-10-CM | POA: Diagnosis not present

## 2016-02-11 DIAGNOSIS — K219 Gastro-esophageal reflux disease without esophagitis: Secondary | ICD-10-CM | POA: Diagnosis not present

## 2016-02-11 DIAGNOSIS — N83209 Unspecified ovarian cyst, unspecified side: Secondary | ICD-10-CM | POA: Diagnosis not present

## 2016-03-05 DIAGNOSIS — N8301 Follicular cyst of right ovary: Secondary | ICD-10-CM | POA: Diagnosis not present

## 2016-03-05 DIAGNOSIS — N83202 Unspecified ovarian cyst, left side: Secondary | ICD-10-CM | POA: Diagnosis not present

## 2017-02-01 DIAGNOSIS — B009 Herpesviral infection, unspecified: Secondary | ICD-10-CM | POA: Diagnosis not present

## 2017-02-01 DIAGNOSIS — Z01411 Encounter for gynecological examination (general) (routine) with abnormal findings: Secondary | ICD-10-CM | POA: Diagnosis not present

## 2017-02-01 DIAGNOSIS — N898 Other specified noninflammatory disorders of vagina: Secondary | ICD-10-CM | POA: Diagnosis not present

## 2017-02-01 DIAGNOSIS — B373 Candidiasis of vulva and vagina: Secondary | ICD-10-CM | POA: Diagnosis not present

## 2017-02-18 DIAGNOSIS — J309 Allergic rhinitis, unspecified: Secondary | ICD-10-CM | POA: Diagnosis not present

## 2017-02-18 DIAGNOSIS — N649 Disorder of breast, unspecified: Secondary | ICD-10-CM | POA: Diagnosis not present

## 2017-02-18 DIAGNOSIS — Z1231 Encounter for screening mammogram for malignant neoplasm of breast: Secondary | ICD-10-CM | POA: Diagnosis not present

## 2017-02-18 DIAGNOSIS — K589 Irritable bowel syndrome without diarrhea: Secondary | ICD-10-CM | POA: Diagnosis not present

## 2017-03-26 DIAGNOSIS — N62 Hypertrophy of breast: Secondary | ICD-10-CM | POA: Diagnosis not present

## 2017-03-26 DIAGNOSIS — R921 Mammographic calcification found on diagnostic imaging of breast: Secondary | ICD-10-CM | POA: Diagnosis not present

## 2017-06-18 DIAGNOSIS — N62 Hypertrophy of breast: Secondary | ICD-10-CM | POA: Diagnosis not present

## 2017-06-18 DIAGNOSIS — N644 Mastodynia: Secondary | ICD-10-CM | POA: Diagnosis not present

## 2017-06-21 ENCOUNTER — Ambulatory Visit (INDEPENDENT_AMBULATORY_CARE_PROVIDER_SITE_OTHER): Payer: BLUE CROSS/BLUE SHIELD

## 2017-06-21 ENCOUNTER — Ambulatory Visit: Payer: Self-pay

## 2017-06-21 ENCOUNTER — Ambulatory Visit: Payer: BLUE CROSS/BLUE SHIELD | Admitting: Physician Assistant

## 2017-06-21 ENCOUNTER — Ambulatory Visit (INDEPENDENT_AMBULATORY_CARE_PROVIDER_SITE_OTHER): Payer: BLUE CROSS/BLUE SHIELD | Admitting: Physician Assistant

## 2017-06-21 ENCOUNTER — Encounter: Payer: Self-pay | Admitting: Physician Assistant

## 2017-06-21 VITALS — BP 137/92 | HR 72 | Temp 98.4°F | Resp 16 | Ht 69.0 in | Wt 159.2 lb

## 2017-06-21 DIAGNOSIS — R0789 Other chest pain: Secondary | ICD-10-CM

## 2017-06-21 DIAGNOSIS — R079 Chest pain, unspecified: Secondary | ICD-10-CM | POA: Diagnosis not present

## 2017-06-21 DIAGNOSIS — D72819 Decreased white blood cell count, unspecified: Secondary | ICD-10-CM

## 2017-06-21 LAB — POCT CBC
GRANULOCYTE PERCENT: 58.8 % (ref 37–80)
HCT, POC: 38.7 % (ref 37.7–47.9)
Hemoglobin: 12.3 g/dL (ref 12.2–16.2)
Lymph, poc: 1.3 (ref 0.6–3.4)
MCH, POC: 27.4 pg (ref 27–31.2)
MCHC: 31.8 g/dL (ref 31.8–35.4)
MCV: 86.3 fL (ref 80–97)
MID (cbc): 0.3 (ref 0–0.9)
MPV: 8.5 fL (ref 0–99.8)
POC Granulocyte: 2.2 (ref 2–6.9)
POC LYMPH %: 33.4 % (ref 10–50)
POC MID %: 7.8 %M (ref 0–12)
Platelet Count, POC: 226 10*3/uL (ref 142–424)
RBC: 4.49 M/uL (ref 4.04–5.48)
RDW, POC: 13 %
WBC: 3.8 10*3/uL — AB (ref 4.6–10.2)

## 2017-06-21 LAB — LIPID PANEL
CHOL/HDL RATIO: 2.3 ratio (ref 0.0–4.4)
Cholesterol, Total: 163 mg/dL (ref 100–199)
HDL: 72 mg/dL (ref >39)
LDL CALC: 83 mg/dL (ref 0–99)
TRIGLYCERIDES: 42 mg/dL (ref 0–149)
VLDL Cholesterol Cal: 8 mg/dL (ref 5–40)

## 2017-06-21 LAB — CMP AND LIVER
ALBUMIN: 4.6 g/dL (ref 3.5–5.5)
ALT: 12 IU/L (ref 0–32)
AST: 14 IU/L (ref 0–40)
Alkaline Phosphatase: 76 IU/L (ref 39–117)
BUN: 6 mg/dL (ref 6–24)
Bilirubin Total: 0.7 mg/dL (ref 0.0–1.2)
Bilirubin, Direct: 0.17 mg/dL (ref 0.00–0.40)
CO2: 24 mmol/L (ref 20–29)
CREATININE: 0.75 mg/dL (ref 0.57–1.00)
Calcium: 9 mg/dL (ref 8.7–10.2)
Chloride: 104 mmol/L (ref 96–106)
GFR calc Af Amer: 110 mL/min/{1.73_m2} (ref >59)
GFR, EST NON AFRICAN AMERICAN: 95 mL/min/{1.73_m2} (ref >59)
Glucose: 87 mg/dL (ref 65–99)
Potassium: 4.1 mmol/L (ref 3.5–5.2)
Sodium: 142 mmol/L (ref 134–144)
Total Protein: 7.1 g/dL (ref 6.0–8.5)

## 2017-06-21 LAB — POCT URINALYSIS DIP (MANUAL ENTRY)
Bilirubin, UA: NEGATIVE
Glucose, UA: NEGATIVE mg/dL
Ketones, POC UA: NEGATIVE mg/dL
Leukocytes, UA: NEGATIVE
Nitrite, UA: NEGATIVE
PROTEIN UA: NEGATIVE mg/dL
SPEC GRAV UA: 1.01 (ref 1.010–1.025)
UROBILINOGEN UA: 0.2 U/dL
pH, UA: 7 (ref 5.0–8.0)

## 2017-06-21 LAB — POCT GLYCOSYLATED HEMOGLOBIN (HGB A1C): Hemoglobin A1C: 5.1

## 2017-06-21 LAB — D-DIMER, QUANTITATIVE: D-DIMER: 0.29 mg/L FEU (ref 0.00–0.49)

## 2017-06-21 LAB — TROPONIN I

## 2017-06-21 MED ORDER — GI COCKTAIL ~~LOC~~
30.0000 mL | Freq: Once | ORAL | Status: AC
  Administered 2017-06-21: 30 mL via ORAL

## 2017-06-21 NOTE — Telephone Encounter (Signed)
   Reason for Disposition . [1] Chest pain lasting <= 5 minutes AND [2] NO chest pain or cardiac symptoms now(Exceptions: pains lasting a few seconds)  Answer Assessment - Initial Assessment Questions 1. LOCATION: "Where does it hurt?"       No pain now- yesterday. Chest over the left side 2. RADIATION: "Does the pain go anywhere else?" (e.g., into neck, jaw, arms, back)     Back of left shoulder 3. ONSET: "When did the chest pain begin?" (Minutes, hours or days)      Yesterday 4. PATTERN "Does the pain come and go, or has it been constant since it started?"  "Does it get worse with exertion?"      Comes and goes 5. DURATION: "How long does it last" (e.g., seconds, minutes, hours)     Less than 5 seconds. 6. SEVERITY: "How bad is the pain?"  (e.g., Scale 1-10; mild, moderate, or severe)    - MILD (1-3): doesn't interfere with normal activities     - MODERATE (4-7): interferes with normal activities or awakens from sleep    - SEVERE (8-10): excruciating pain, unable to do any normal activities       8 7. CARDIAC RISK FACTORS: "Do you have any history of heart problems or risk factors for heart disease?" (e.g., prior heart attack, angina; high blood pressure, diabetes, being overweight, high cholesterol, smoking, or strong family history of heart disease)     No 8. PULMONARY RISK FACTORS: "Do you have any history of lung disease?"  (e.g., blood clots in lung, asthma, emphysema, birth control pills)     No 9. CAUSE: "What do you think is causing the chest pain?"     Unsure 10. OTHER SYMPTOMS: "Do you have any other symptoms?" (e.g., dizziness, nausea, vomiting, sweating, fever, difficulty breathing, cough)       Nausea 11. PREGNANCY: "Is there any chance you are pregnant?" "When was your last menstrual period?"       No  Protocols used: CHEST PAIN-A-AH

## 2017-06-21 NOTE — Progress Notes (Signed)
Can we please add on a peripheral smear.  Diagnosis leukopenia.

## 2017-06-21 NOTE — Addendum Note (Signed)
Addended by: Gari Crown D on: 06/21/2017 05:04 PM   Modules accepted: Orders

## 2017-06-21 NOTE — Progress Notes (Signed)
06/21/2017 2:21 PM   DOB: 04-26-1970 / MRN: [id number]  SUBJECTIVE:  Deanna [name] is a 47 y.o. female presenting for substernal left-sided sharp and excruciating chest pain that last 3-10 seconds.  She associates some mild nausea with this.  She denies shortness of breath, presyncope.  She does have a radicular pattern to the left neck and shoulder.  Physical activity does not make the pain worse.  The most recent episode occurred last night while she was trying to sleep at 3 AM.  She has No Known Allergies.   She  has a past medical history of Allergy, BV (bacterial vaginosis), GERD (gastroesophageal reflux disease), HSV-1 infection, IBS (irritable bowel syndrome), Near syncope (02/21/2014), Ovarian cyst, Syncope (02/21/2014), and Yeast infection.    She  reports that  has never smoked. she has never used smokeless tobacco. She reports that she drinks alcohol. She reports that she does not use drugs. She  reports that she currently engages in sexual activity. She reports using the following methods of birth control/protection: Condom and Pill. The patient  has a past surgical history that includes Breast biopsy (2008); laparoscopy (1991); and Supracervical abdominal hysterectomy (N/A, 07/09/2015).  Her family history includes Anemia in her mother; Cancer in her maternal aunt and paternal uncle; Heart disease in her maternal grandmother; Hypertension in her maternal grandmother; Sickle cell trait in her cousin; Throat cancer in her paternal uncle.  Review of Systems  Constitutional: Negative for chills, diaphoresis and fever.  Eyes: Negative.   Respiratory: Negative for cough, hemoptysis, sputum production, shortness of breath and wheezing.   Cardiovascular: Positive for chest pain. Negative for orthopnea and leg swelling.  Gastrointestinal: Negative for nausea.  Skin: Negative for rash.  Neurological: Negative for dizziness, sensory change, speech change, focal weakness and headaches.    The  problem list and medications were reviewed and updated by myself where necessary and exist elsewhere in the encounter.   OBJECTIVE:  BP (!) 137/92 (BP Location: Right Arm, Patient Position: Sitting, Cuff Size: Normal)   Pulse 72   Temp 98.4 F (36.9 C) (Oral)   Resp 16   Ht 5\' 9"  (1.753 m)   Wt 159 lb 3.2 oz (72.2 kg)   LMP 05/25/2015 (Approximate)   SpO2 100%   BMI 23.51 kg/m   Physical Exam  Constitutional: She is oriented to person, place, and time. She is active.  Non-toxic appearance.  Eyes: EOM are normal. Pupils are equal, round, and reactive to light.  Cardiovascular: Normal rate, regular rhythm, S1 normal, S2 normal, normal heart sounds and intact distal pulses. Exam reveals no gallop, no friction rub and no decreased pulses.  No murmur heard. Pulmonary/Chest: Effort normal. No stridor. No tachypnea. No respiratory distress. She has no wheezes. She has no rales.  Abdominal: Soft. Normal appearance and bowel sounds are normal. She exhibits no distension and no mass. There is no tenderness. There is no rigidity, no rebound, no guarding and no CVA tenderness.  Musculoskeletal: She exhibits no edema.  Neurological: She is alert and oriented to person, place, and time. She has normal strength and normal reflexes. She is not disoriented. She displays no atrophy. No cranial nerve deficit or sensory deficit. She exhibits normal muscle tone. Coordination and gait normal.  Skin: Skin is warm and dry. She is not diaphoretic. No pallor.  Psychiatric: Her behavior is normal.    Results for orders placed or performed in visit on 06/21/17 (from the past 72 hour(s))  POCT CBC  Status: Abnormal   Collection Time: 06/21/17  1:34 PM  Result Value Ref Range   WBC 3.8 (A) 4.6 - 10.2 K/uL   Lymph, poc 1.3 0.6 - 3.4   POC LYMPH PERCENT 33.4 10 - 50 %L   MID (cbc) 0.3 0 - 0.9   POC MID % 7.8 0 - 12 %M   POC Granulocyte 2.2 2 - 6.9   Granulocyte percent 58.8 37 - 80 %G   RBC 4.49 4.04 -  5.48 M/uL   Hemoglobin 12.3 12.2 - 16.2 g/dL   HCT, POC 38.7 37.7 - 47.9 %   MCV 86.3 80 - 97 fL   MCH, POC 27.4 27 - 31.2 pg   MCHC 31.8 31.8 - 35.4 g/dL   RDW, POC 13.0 %   Platelet Count, POC 226 142 - 424 K/uL   MPV 8.5 0 - 99.8 fL  POCT glycosylated hemoglobin (Hb A1C)     Status: None   Collection Time: 06/21/17  1:39 PM  Result Value Ref Range   Hemoglobin A1C 5.1   POCT urinalysis dipstick     Status: Abnormal   Collection Time: 06/21/17  1:53 PM  Result Value Ref Range   Color, UA yellow yellow   Clarity, UA clear clear   Glucose, UA negative negative mg/dL   Bilirubin, UA negative negative   Ketones, POC UA negative negative mg/dL   Spec Grav, UA 1.010 1.010 - 1.025   Blood, UA trace-lysed (A) negative   pH, UA 7.0 5.0 - 8.0   Protein Ur, POC negative negative mg/dL   Urobilinogen, UA 0.2 0.2 or 1.0 E.U./dL   Nitrite, UA Negative Negative   Leukocytes, UA Negative Negative    Dg Chest 2 View  Result Date: 06/21/2017 CLINICAL DATA:  Atypical chest pain and nausea. EXAM: CHEST  2 VIEW COMPARISON:  11/03/2012 FINDINGS: The heart size and mediastinal contours are within normal limits. Both lungs are clear. Incidentally noted nipple shadows overlying both lung bases. The visualized skeletal structures are unremarkable. IMPRESSION: Negative.  No active cardiopulmonary disease. Electronically Signed   By: Earle Gell M.D.   On: 06/21/2017 13:18    ASSESSMENT AND PLAN:  Tahra was seen today for chest pain.  Diagnoses and all orders for this visit:  Atypical chest pain: -     EKG 12-Lead -     Troponin I -     D-dimer, quantitative (not at [facility]) -     DG Chest 2 View; Future -     POCT CBC -     POCT urinalysis dipstick -     CMP and Liver -     gi cocktail (Maalox,Lidocaine,Donnatal) -     Lipid Panel -     POCT glycosylated hemoglobin (Hb A1C)    The patient is advised to call or return to clinic if she does not see an improvement in symptoms, or to seek the  care of the closest emergency department if she worsens with the above plan.   Philis Fendt, MHS, PA-C [facility] 06/21/2017 2:21 PM

## 2017-06-21 NOTE — Patient Instructions (Addendum)
I will contact you with some results if they are positive. I am having you see cardiology sometime this week or next week.

## 2017-06-22 LAB — LIPID PANEL
CHOLESTEROL TOTAL: 155 mg/dL (ref 100–199)
Chol/HDL Ratio: 2.2 ratio (ref 0.0–4.4)
HDL: 70 mg/dL (ref >39)
LDL Calculated: 77 mg/dL (ref 0–99)
Triglycerides: 42 mg/dL (ref 0–149)
VLDL Cholesterol Cal: 8 mg/dL (ref 5–40)

## 2017-06-22 LAB — CMP AND LIVER
ALBUMIN: 4.5 g/dL (ref 3.5–5.5)
ALK PHOS: 77 IU/L (ref 39–117)
ALT: 6 IU/L (ref 0–32)
AST: 15 IU/L (ref 0–40)
BUN: 5 mg/dL — ABNORMAL LOW (ref 6–24)
Bilirubin Total: 0.6 mg/dL (ref 0.0–1.2)
Bilirubin, Direct: 0.16 mg/dL (ref 0.00–0.40)
CO2: 24 mmol/L (ref 20–29)
Calcium: 9 mg/dL (ref 8.7–10.2)
Chloride: 103 mmol/L (ref 96–106)
Creatinine, Ser: 0.71 mg/dL (ref 0.57–1.00)
GFR calc Af Amer: 117 mL/min/{1.73_m2} (ref >59)
GFR calc non Af Amer: 102 mL/min/{1.73_m2} (ref >59)
Glucose: 81 mg/dL (ref 65–99)
POTASSIUM: 4.4 mmol/L (ref 3.5–5.2)
SODIUM: 140 mmol/L (ref 134–144)
TOTAL PROTEIN: 7.1 g/dL (ref 6.0–8.5)

## 2017-06-22 LAB — H. PYLORI BREATH TEST: H pylori Breath Test: NEGATIVE

## 2017-06-24 LAB — PATHOLOGIST SMEAR REVIEW
BASOS: 1 %
Basophils Absolute: 0 10*3/uL (ref 0.0–0.2)
EOS (ABSOLUTE): 0 10*3/uL (ref 0.0–0.4)
Eos: 1 %
HEMATOCRIT: 37.3 % (ref 34.0–46.6)
Hemoglobin: 12.2 g/dL (ref 11.1–15.9)
IMMATURE GRANS (ABS): 0 10*3/uL (ref 0.0–0.1)
Immature Granulocytes: 0 %
LYMPHS: 29 %
Lymphocytes Absolute: 1.1 10*3/uL (ref 0.7–3.1)
MCH: 27.2 pg (ref 26.6–33.0)
MCHC: 32.7 g/dL (ref 31.5–35.7)
MCV: 83 fL (ref 79–97)
MONOS ABS: 0.3 10*3/uL (ref 0.1–0.9)
Monocytes: 8 %
NEUTROS ABS: 2.3 10*3/uL (ref 1.4–7.0)
Neutrophils: 61 %
PATH REV RBC: NORMAL
PATH REV WBC: NORMAL
Path Rev PLTs: NORMAL
Platelets: 242 10*3/uL (ref 150–379)
RBC: 4.48 x10E6/uL (ref 3.77–5.28)
RDW: 13.7 % (ref 12.3–15.4)
WBC: 3.8 10*3/uL (ref 3.4–10.8)

## 2017-06-25 ENCOUNTER — Encounter: Payer: Self-pay | Admitting: Radiology

## 2017-07-06 DIAGNOSIS — R110 Nausea: Secondary | ICD-10-CM | POA: Diagnosis not present

## 2017-07-06 DIAGNOSIS — R0789 Other chest pain: Secondary | ICD-10-CM | POA: Diagnosis not present

## 2017-07-06 DIAGNOSIS — K581 Irritable bowel syndrome with constipation: Secondary | ICD-10-CM | POA: Diagnosis not present

## 2017-07-06 DIAGNOSIS — K219 Gastro-esophageal reflux disease without esophagitis: Secondary | ICD-10-CM | POA: Diagnosis not present

## 2017-07-08 DIAGNOSIS — Z0189 Encounter for other specified special examinations: Secondary | ICD-10-CM | POA: Diagnosis not present

## 2017-07-08 DIAGNOSIS — R079 Chest pain, unspecified: Secondary | ICD-10-CM | POA: Diagnosis not present

## 2017-07-16 DIAGNOSIS — R0789 Other chest pain: Secondary | ICD-10-CM | POA: Diagnosis not present

## 2017-07-20 ENCOUNTER — Encounter: Payer: Self-pay | Admitting: Physician Assistant

## 2017-08-25 ENCOUNTER — Ambulatory Visit (INDEPENDENT_AMBULATORY_CARE_PROVIDER_SITE_OTHER): Payer: BLUE CROSS/BLUE SHIELD | Admitting: Physician Assistant

## 2017-08-25 ENCOUNTER — Ambulatory Visit (INDEPENDENT_AMBULATORY_CARE_PROVIDER_SITE_OTHER): Payer: BLUE CROSS/BLUE SHIELD

## 2017-08-25 ENCOUNTER — Other Ambulatory Visit: Payer: Self-pay

## 2017-08-25 ENCOUNTER — Encounter: Payer: Self-pay | Admitting: Physician Assistant

## 2017-08-25 VITALS — BP 110/74 | HR 77 | Temp 98.5°F | Ht 69.0 in | Wt 159.4 lb

## 2017-08-25 DIAGNOSIS — R109 Unspecified abdominal pain: Secondary | ICD-10-CM

## 2017-08-25 DIAGNOSIS — R3129 Other microscopic hematuria: Secondary | ICD-10-CM

## 2017-08-25 DIAGNOSIS — R319 Hematuria, unspecified: Secondary | ICD-10-CM | POA: Diagnosis not present

## 2017-08-25 DIAGNOSIS — R1012 Left upper quadrant pain: Secondary | ICD-10-CM | POA: Diagnosis not present

## 2017-08-25 LAB — POCT CBC
Granulocyte percent: 61.7 %G (ref 37–80)
HCT, POC: 39.3 % (ref 37.7–47.9)
Hemoglobin: 12.7 g/dL (ref 12.2–16.2)
LYMPH, POC: 1.3 (ref 0.6–3.4)
MCH: 27.7 pg (ref 27–31.2)
MCHC: 32.2 g/dL (ref 31.8–35.4)
MCV: 86.1 fL (ref 80–97)
MID (CBC): 0.3 (ref 0–0.9)
MPV: 8.2 fL (ref 0–99.8)
POC Granulocyte: 2.5 (ref 2–6.9)
POC LYMPH %: 31.9 % (ref 10–50)
POC MID %: 6.4 % (ref 0–12)
Platelet Count, POC: 211 10*3/uL (ref 142–424)
RBC: 4.57 M/uL (ref 4.04–5.48)
RDW, POC: 13.2 %
WBC: 4.1 10*3/uL — AB (ref 4.6–10.2)

## 2017-08-25 LAB — POCT URINALYSIS DIP (MANUAL ENTRY)
BILIRUBIN UA: NEGATIVE
GLUCOSE UA: NEGATIVE mg/dL
Ketones, POC UA: NEGATIVE mg/dL
LEUKOCYTES UA: NEGATIVE
NITRITE UA: NEGATIVE
Protein Ur, POC: NEGATIVE mg/dL
Spec Grav, UA: 1.025 (ref 1.010–1.025)
Urobilinogen, UA: 0.2 E.U./dL
pH, UA: 5.5 (ref 5.0–8.0)

## 2017-08-25 LAB — POCT URINE PREGNANCY: PREG TEST UR: NEGATIVE

## 2017-08-25 LAB — POC MICROSCOPIC URINALYSIS (UMFC): Mucus: ABSENT

## 2017-08-25 MED ORDER — ONDANSETRON HCL 4 MG PO TABS: 4.0000 mg | ORAL_TABLET | Freq: Three times a day (TID) | ORAL | 0 refills | Status: DC | PRN

## 2017-08-25 NOTE — Progress Notes (Signed)
08/25/2017 11:05 AM   DOB: 1971-03-29 / MRN: [id number]  SUBJECTIVE:  Deanna [name] is a 47 y.o. female presenting for left flank pain with a radicular pattern to the left lower abdomen that is accompanied by nausea.  She tells me the pain waxes and wanes.  She has had no emesis.  She is moving her bowels normally for her which is roughly once every 2 to 3 days.  She does drink prune juice and this helped to move her bowels more often when she feels that she is getting constipated.  The pain she is experiencing does not improve with a bowel movement. She has No Known Allergies.   She  has a past medical history of Allergy, BV (bacterial vaginosis), GERD (gastroesophageal reflux disease), HSV-1 infection, IBS (irritable bowel syndrome), Near syncope (02/21/2014), Ovarian cyst, Syncope (02/21/2014), and Yeast infection.    She  reports that she has never smoked. She has never used smokeless tobacco. She reports that she drinks alcohol. She reports that she does not use drugs. She  reports that she currently engages in sexual activity. She reports using the following methods of birth control/protection: Condom and Pill. The patient  has a past surgical history that includes Breast biopsy (2008); laparoscopy (1991); and Supracervical abdominal hysterectomy (N/A, 07/09/2015).  Her family history includes Anemia in her mother; Cancer in her maternal aunt and paternal uncle; Heart disease in her maternal grandmother; Hypertension in her maternal grandmother; Sickle cell trait in her cousin; Throat cancer in her paternal uncle.  Review of Systems  Constitutional: Negative for chills, diaphoresis and fever.  Respiratory: Negative for cough, hemoptysis, sputum production, shortness of breath and wheezing.   Cardiovascular: Negative for chest pain, orthopnea and leg swelling.  Gastrointestinal: Negative for abdominal pain, blood in stool, constipation, diarrhea, heartburn, melena, nausea and vomiting.    Genitourinary: Negative for dysuria, flank pain, frequency, hematuria and urgency.  Skin: Negative for rash.  Neurological: Negative for dizziness.    The problem list and medications were reviewed and updated by myself where necessary and exist elsewhere in the encounter.   OBJECTIVE:  BP 110/74 (BP Location: Left Arm, Patient Position: Sitting, Cuff Size: Normal)   Pulse 77   Temp 98.5 F (36.9 C) (Oral)   Ht 5\' 9"  (1.753 m)   Wt 159 lb 6.4 oz (72.3 kg)   LMP 05/25/2015 (Approximate)   SpO2 99%   BMI 23.54 kg/m   Physical Exam  Constitutional: She is oriented to person, place, and time. She appears well-nourished. No distress.  Eyes: Pupils are equal, round, and reactive to light. EOM are normal.  Cardiovascular: Normal rate.  Pulmonary/Chest: Effort normal.  Abdominal: Soft. Bowel sounds are normal. She exhibits no distension and no mass. There is no tenderness. There is no rebound and no guarding. No hernia.  Neurological: She is alert and oriented to person, place, and time. No cranial nerve deficit. Gait normal.  Skin: Skin is dry. She is not diaphoretic.  Psychiatric: She has a normal mood and affect.  Vitals reviewed.   Results for orders placed or performed in visit on 08/25/17 (from the past 72 hour(s))  POCT CBC     Status: Abnormal   Collection Time: 08/25/17 10:08 AM  Result Value Ref Range   WBC 4.1 (A) 4.6 - 10.2 K/uL   Lymph, poc 1.3 0.6 - 3.4   POC LYMPH PERCENT 31.9 10 - 50 %L   MID (cbc) 0.3 0 - 0.9   POC  MID % 6.4 0 - 12 %M   POC Granulocyte 2.5 2 - 6.9   Granulocyte percent 61.7 37 - 80 %G   RBC 4.57 4.04 - 5.48 M/uL   Hemoglobin 12.7 12.2 - 16.2 g/dL   HCT, POC 39.3 37.7 - 47.9 %   MCV 86.1 80 - 97 fL   MCH, POC 27.7 27 - 31.2 pg   MCHC 32.2 31.8 - 35.4 g/dL   RDW, POC 13.2 %   Platelet Count, POC 211 142 - 424 K/uL   MPV 8.2 0 - 99.8 fL  POCT urinalysis dipstick     Status: Abnormal   Collection Time: 08/25/17 10:09 AM  Result Value Ref  Range   Color, UA yellow yellow   Clarity, UA clear clear   Glucose, UA negative negative mg/dL   Bilirubin, UA negative negative   Ketones, POC UA negative negative mg/dL   Spec Grav, UA 1.025 1.010 - 1.025   Blood, UA moderate (A) negative   pH, UA 5.5 5.0 - 8.0   Protein Ur, POC negative negative mg/dL   Urobilinogen, UA 0.2 0.2 or 1.0 E.U./dL   Nitrite, UA Negative Negative   Leukocytes, UA Negative Negative  POCT urine pregnancy     Status: None   Collection Time: 08/25/17 10:31 AM  Result Value Ref Range   Preg Test, Ur Negative Negative  POCT Microscopic Urinalysis ([facility])     Status: Abnormal   Collection Time: 08/25/17 10:33 AM  Result Value Ref Range   WBC,UR,HPF,POC None None WBC/hpf   RBC,UR,HPF,POC None None RBC/hpf   Bacteria Few (A) None, Too numerous to count   Mucus Absent Absent   Epithelial Cells, UR Per Microscopy Moderate (A) None, Too numerous to count cells/hpf    Dg Abd 1 View  Result Date: 08/25/2017 CLINICAL DATA:  Nausea and flank pain with hematuria EXAM: ABDOMEN - 1 VIEW COMPARISON:  11/12/2015 FINDINGS: Scattered large and small bowel gas is noted. Mild fecal material is noted within the ascending and descending colons consistent with a mild degree of constipation. No obstructive changes are seen. No free air is noted. No abnormal mass or abnormal calcifications are noted. IMPRESSION: Findings suggestive of mild constipation. Electronically Signed   By: Inez Catalina M.D.   On: 08/25/2017 10:32    ASSESSMENT AND PLAN:  Eula was seen today for nausea and back pain.  Diagnoses and all orders for this visit:  Abdominal pain, unspecified abdominal location: Given her symptoms and hematuria on dip think it is highly possible she has a kidney stone.  Unfortunately rads were negative.  I will order a CT renal study.  In the meantime it is reasonable to treat her for kidney stones with NSAIDs, Zofran, and push p.o. fluids per AVS.  I will contact her with  regard to follow-up after the results of the CT. -     POCT CBC -     POCT urinalysis dipstick -     Renal Function Panel -     POCT urine pregnancy -     ondansetron (ZOFRAN) 4 MG tablet; Take 1 tablet (4 mg total) by mouth every 8 (eight) hours as needed for nausea or vomiting. -     Care order/instruction:  Other microscopic hematuria -     DG Abd 1 View; Future -     POCT Microscopic Urinalysis ([facility])  Left upper quadrant pain -     CT RENAL STONE STUDY; Future    The  patient is advised to call or return to clinic if she does not see an improvement in symptoms, or to seek the care of the closest emergency department if she worsens with the above plan.   Philis Fendt, MHS, PA-C [facility] 08/25/2017 11:05 AM

## 2017-08-25 NOTE — Patient Instructions (Signed)
Whenever you have this pain and nausea please take the nausea pill and take 600 mg of ibuprofen.  If this pain recurs I want you to try and drink about 1/2 gallon of water every 3-4 hour.  Along with the nausea medication and ibuprofen.  You will receive a phone call regarding the scan of your kidneys.  I am glad you saw cardiology and they ruled out cardiac disease.

## 2017-08-26 DIAGNOSIS — S161XXA Strain of muscle, fascia and tendon at neck level, initial encounter: Secondary | ICD-10-CM | POA: Diagnosis not present

## 2017-08-26 DIAGNOSIS — S29019A Strain of muscle and tendon of unspecified wall of thorax, initial encounter: Secondary | ICD-10-CM | POA: Diagnosis not present

## 2017-08-26 LAB — RENAL FUNCTION PANEL
ALBUMIN: 4.7 g/dL (ref 3.5–5.5)
BUN/Creatinine Ratio: 11 (ref 9–23)
BUN: 9 mg/dL (ref 6–24)
CALCIUM: 9.4 mg/dL (ref 8.7–10.2)
CHLORIDE: 104 mmol/L (ref 96–106)
CO2: 19 mmol/L — AB (ref 20–29)
Creatinine, Ser: 0.84 mg/dL (ref 0.57–1.00)
GFR calc Af Amer: 95 mL/min/{1.73_m2} (ref >59)
GFR calc non Af Amer: 82 mL/min/{1.73_m2} (ref >59)
Glucose: 87 mg/dL (ref 65–99)
POTASSIUM: 4.5 mmol/L (ref 3.5–5.2)
Phosphorus: 3.9 mg/dL (ref 2.5–4.5)
Sodium: 142 mmol/L (ref 134–144)

## 2017-08-26 LAB — HEPATIC FUNCTION PANEL
ALBUMIN: 4.6 g/dL (ref 3.5–5.5)
ALK PHOS: 72 IU/L (ref 39–117)
ALT: 9 IU/L (ref 0–32)
AST: 14 IU/L (ref 0–40)
BILIRUBIN TOTAL: 0.5 mg/dL (ref 0.0–1.2)
Bilirubin, Direct: 0.13 mg/dL (ref 0.00–0.40)
TOTAL PROTEIN: 7.2 g/dL (ref 6.0–8.5)

## 2017-08-26 LAB — LIPASE: Lipase: 38 U/L (ref 14–72)

## 2017-09-01 ENCOUNTER — Inpatient Hospital Stay: Payer: BLUE CROSS/BLUE SHIELD | Admitting: Physician Assistant

## 2017-09-03 ENCOUNTER — Telehealth: Payer: Self-pay | Admitting: Physician Assistant

## 2017-09-03 NOTE — Telephone Encounter (Signed)
Will order the [place] instead. Thank you. Adding to old encounter now. Philis Fendt, MS, PA-C 4:22 PM, 09/03/2017

## 2017-09-03 NOTE — Addendum Note (Signed)
Addended by: Tereasa Coop on: 09/03/2017 04:23 PM   Modules accepted: Orders

## 2017-09-03 NOTE — Telephone Encounter (Signed)
Pt's CT Renal Deanna [name] has not been approved by [employer]. I spoke with a nurse provider to give additional clinicals, but case had to go to further review. Case is to close on 5/21. There was no recent U/S nor did we know what kind of previous stones pt has had, so this had to go for further review. Provider can also call to provide peer to peer information at [phone] and provide the member ID which is [id number]. Thanks!

## 2017-09-04 ENCOUNTER — Encounter: Payer: Self-pay | Admitting: Physician Assistant

## 2017-09-14 ENCOUNTER — Other Ambulatory Visit: Payer: Self-pay | Admitting: Physician Assistant

## 2017-09-14 ENCOUNTER — Ambulatory Visit
Admission: RE | Admit: 2017-09-14 | Discharge: 2017-09-14 | Disposition: A | Discharge status: Home / Self Care | Payer: BLUE CROSS/BLUE SHIELD | Source: Ambulatory Visit | Attending: Physician Assistant | Admitting: Physician Assistant

## 2017-09-14 DIAGNOSIS — N281 Cyst of kidney, acquired: Secondary | ICD-10-CM

## 2017-09-14 DIAGNOSIS — R109 Unspecified abdominal pain: Secondary | ICD-10-CM

## 2017-09-14 DIAGNOSIS — R319 Hematuria, unspecified: Secondary | ICD-10-CM | POA: Diagnosis not present

## 2017-09-21 ENCOUNTER — Telehealth: Payer: Self-pay | Admitting: Physician Assistant

## 2017-09-21 NOTE — Telephone Encounter (Signed)
Patient called returning message left from [facility] regarding renal  ultrasound results Results not in [facility] results que [phone] [id number] allows [facility] to give results  Result note From Philis Fendt entered on 09/14/2017 read to Patient

## 2017-11-10 DIAGNOSIS — Z0000 Encounter for general adult medical examination without abnormal findings: Secondary | ICD-10-CM | POA: Diagnosis not present

## 2017-12-01 ENCOUNTER — Encounter: Payer: Self-pay | Admitting: Physician Assistant

## 2017-12-01 ENCOUNTER — Ambulatory Visit
Admission: RE | Admit: 2017-12-01 | Discharge: 2017-12-01 | Disposition: A | Discharge status: Home / Self Care | Payer: BLUE CROSS/BLUE SHIELD | Source: Ambulatory Visit | Attending: Physician Assistant | Admitting: Physician Assistant

## 2017-12-01 ENCOUNTER — Other Ambulatory Visit: Payer: Self-pay

## 2017-12-01 ENCOUNTER — Ambulatory Visit (INDEPENDENT_AMBULATORY_CARE_PROVIDER_SITE_OTHER): Payer: BLUE CROSS/BLUE SHIELD | Admitting: Physician Assistant

## 2017-12-01 VITALS — BP 118/78 | HR 74 | Temp 98.6°F | Resp 16 | Ht 69.0 in | Wt 162.8 lb

## 2017-12-01 DIAGNOSIS — M7989 Other specified soft tissue disorders: Secondary | ICD-10-CM

## 2017-12-01 DIAGNOSIS — R221 Localized swelling, mass and lump, neck: Secondary | ICD-10-CM | POA: Diagnosis not present

## 2017-12-01 DIAGNOSIS — M799 Soft tissue disorder, unspecified: Secondary | ICD-10-CM

## 2017-12-01 NOTE — Patient Instructions (Addendum)
     We will be contacting you very soon about the getting this image done. I will contact you once the image is in.   I will contact you with your lab results within the next 2 weeks.  If you have not heard from [place] then please contact us. The fastest way to get your results is to register for My Chart.   IF you received an x-ray today, you will receive an invoice from [facility]. Please contact [facility] at [phone] with questions or concerns regarding your invoice.   IF you received labwork today, you will receive an invoice from [facility]. Please contact [facility] at [phone] with questions or concerns regarding your invoice.   Our billing staff will not be able to assist you with questions regarding bills from these companies.  You will be contacted with the lab results as soon as they are available. The fastest way to get your results is to activate your My Chart account. Instructions are located on the last page of this paperwork. If you have not heard from [place] regarding the results in 2 weeks, please contact this office.

## 2017-12-01 NOTE — Progress Notes (Signed)
1 

## 2017-12-01 NOTE — Progress Notes (Signed)
12/01/2017 8:49 AM   DOB: October 11, 1970 / MRN: [id number]  SUBJECTIVE:  Deanna [name] is a 47 y.o. female presenting for a painless soft tissue mass on the left posterior chest wall. This was found by a massage therapist.  The problem is not worsening. She has tried nothing. She wants to know what it is definitively.   She has No Known Allergies.   She  has a past medical history of Allergy, BV (bacterial vaginosis), GERD (gastroesophageal reflux disease), HSV-1 infection, IBS (irritable bowel syndrome), Near syncope (02/21/2014), Ovarian cyst, Syncope (02/21/2014), and Yeast infection.    She  reports that she has never smoked. She has never used smokeless tobacco. She reports that she drinks alcohol. She reports that she does not use drugs. She  reports that she currently engages in sexual activity. She reports using the following methods of birth control/protection: Condom and Pill. The patient  has a past surgical history that includes Breast biopsy (2008); laparoscopy (1991); and Supracervical abdominal hysterectomy (N/A, 07/09/2015).  Her family history includes Anemia in her mother; Cancer in her maternal aunt and paternal uncle; Heart disease in her maternal grandmother; Hypertension in her maternal grandmother; Sickle cell trait in her cousin; Throat cancer in her paternal uncle.  Review of Systems  Constitutional: Negative for chills, diaphoresis and fever.  Eyes: Negative.   Respiratory: Negative for cough, hemoptysis, sputum production, shortness of breath and wheezing.   Cardiovascular: Negative for chest pain, orthopnea and leg swelling.  Gastrointestinal: Negative for abdominal pain, blood in stool, constipation, diarrhea, heartburn, melena, nausea and vomiting.  Genitourinary: Negative for dysuria, flank pain, frequency, hematuria and urgency.  Skin: Negative for rash.  Neurological: Negative for dizziness, sensory change, speech change, focal weakness and headaches.    The  problem list and medications were reviewed and updated by myself where necessary and exist elsewhere in the encounter.   OBJECTIVE:  BP 118/78   Pulse 74   Temp 98.6 F (37 C)   Resp 16   Ht 5\' 9"  (1.753 m)   Wt 162 lb 12.8 oz (73.8 kg)   LMP 05/25/2015 (Approximate)   SpO2 97%   BMI 24.04 kg/m   Wt Readings from Last 3 Encounters:  12/01/17 162 lb 12.8 oz (73.8 kg)  08/25/17 159 lb 6.4 oz (72.3 kg)  06/21/17 159 lb 3.2 oz (72.2 kg)   Temp Readings from Last 3 Encounters:  12/01/17 98.6 F (37 C)  08/25/17 98.5 F (36.9 C) (Oral)  06/21/17 98.4 F (36.9 C) (Oral)   BP Readings from Last 3 Encounters:  12/01/17 118/78  08/25/17 110/74  06/21/17 (!) 137/92   Pulse Readings from Last 3 Encounters:  12/01/17 74  08/25/17 77  06/21/17 72    Physical Exam  Constitutional: She is oriented to person, place, and time. She appears well-nourished. No distress.  Eyes: Pupils are equal, round, and reactive to light. EOM are normal.  Cardiovascular: Normal rate, regular rhythm, S1 normal, S2 normal, normal heart sounds and intact distal pulses. Exam reveals no gallop, no friction rub and no decreased pulses.  No murmur heard. Pulmonary/Chest: Effort normal. No stridor. No respiratory distress. She has no wheezes. She has no rales.      Abdominal: She exhibits no distension.  Musculoskeletal: She exhibits no edema.  Neurological: She is alert and oriented to person, place, and time. No cranial nerve deficit. Gait normal.  Skin: Skin is dry. She is not diaphoretic.  Psychiatric: She has a normal mood  and affect.  Vitals reviewed.   Lab Results  Component Value Date   HGBA1C 5.1 06/21/2017    Lab Results  Component Value Date   WBC 4.1 (A) 08/25/2017   HGB 12.7 08/25/2017   HCT 39.3 08/25/2017   MCV 86.1 08/25/2017   PLT 242 06/21/2017    Lab Results  Component Value Date   CREATININE 0.84 08/25/2017   BUN 9 08/25/2017   NA 142 08/25/2017   K 4.5 08/25/2017    CL 104 08/25/2017   CO2 19 (L) 08/25/2017    Lab Results  Component Value Date   ALT 9 08/25/2017   AST 14 08/25/2017   ALKPHOS 72 08/25/2017   BILITOT 0.5 08/25/2017    No results found for: TSH  Lab Results  Component Value Date   CHOL 155 06/21/2017   HDL 70 06/21/2017   LDLCALC 77 06/21/2017   TRIG 42 06/21/2017   CHOLHDL 2.2 06/21/2017     ASSESSMENT AND PLAN:  Brionne was seen today for mass.  Diagnoses and all orders for this visit:  Soft tissue mass -     [place] CHEST SOFT TISSUE; Future    The patient is advised to call or return to clinic if she does not see an improvement in symptoms, or to seek the care of the closest emergency department if she worsens with the above plan.   Philis Fendt, MHS, PA-C [facility] 12/01/2017 8:49 AM

## 2017-12-15 ENCOUNTER — Encounter: Payer: Self-pay | Admitting: Physician Assistant

## 2017-12-16 DIAGNOSIS — N281 Cyst of kidney, acquired: Secondary | ICD-10-CM | POA: Diagnosis not present

## 2017-12-21 ENCOUNTER — Other Ambulatory Visit: Payer: Self-pay | Admitting: Urology

## 2017-12-21 DIAGNOSIS — N281 Cyst of kidney, acquired: Secondary | ICD-10-CM

## 2017-12-28 ENCOUNTER — Other Ambulatory Visit: Payer: BLUE CROSS/BLUE SHIELD

## 2017-12-29 ENCOUNTER — Ambulatory Visit
Admission: RE | Admit: 2017-12-29 | Discharge: 2017-12-29 | Disposition: A | Discharge status: Home / Self Care | Payer: BLUE CROSS/BLUE SHIELD | Source: Ambulatory Visit | Attending: Urology | Admitting: Urology

## 2017-12-29 DIAGNOSIS — N281 Cyst of kidney, acquired: Secondary | ICD-10-CM | POA: Diagnosis not present

## 2017-12-29 MED ORDER — GADOBENATE DIMEGLUMINE 529 MG/ML IV SOLN
15.0000 mL | Freq: Once | INTRAVENOUS | Status: AC | PRN
  Administered 2017-12-29: 15 mL via INTRAVENOUS

## 2018-01-05 DIAGNOSIS — K219 Gastro-esophageal reflux disease without esophagitis: Secondary | ICD-10-CM | POA: Diagnosis not present

## 2018-01-05 DIAGNOSIS — K589 Irritable bowel syndrome without diarrhea: Secondary | ICD-10-CM | POA: Diagnosis not present

## 2018-02-02 DIAGNOSIS — Z6823 Body mass index (BMI) 23.0-23.9, adult: Secondary | ICD-10-CM | POA: Diagnosis not present

## 2018-02-02 DIAGNOSIS — Z01411 Encounter for gynecological examination (general) (routine) with abnormal findings: Secondary | ICD-10-CM | POA: Diagnosis not present

## 2018-02-02 DIAGNOSIS — N898 Other specified noninflammatory disorders of vagina: Secondary | ICD-10-CM | POA: Diagnosis not present

## 2018-02-02 DIAGNOSIS — N930 Postcoital and contact bleeding: Secondary | ICD-10-CM | POA: Diagnosis not present

## 2018-02-02 DIAGNOSIS — Z124 Encounter for screening for malignant neoplasm of cervix: Secondary | ICD-10-CM | POA: Diagnosis not present

## 2018-02-21 DIAGNOSIS — Z1231 Encounter for screening mammogram for malignant neoplasm of breast: Secondary | ICD-10-CM | POA: Diagnosis not present

## 2018-05-25 ENCOUNTER — Other Ambulatory Visit: Payer: Self-pay

## 2018-05-25 ENCOUNTER — Ambulatory Visit (INDEPENDENT_AMBULATORY_CARE_PROVIDER_SITE_OTHER): Payer: 59 | Admitting: Physician Assistant

## 2018-05-25 ENCOUNTER — Encounter: Payer: Self-pay | Admitting: Physician Assistant

## 2018-05-25 VITALS — BP 131/79 | HR 65 | Temp 98.5°F | Resp 20 | Ht 69.09 in | Wt 165.2 lb

## 2018-05-25 DIAGNOSIS — J069 Acute upper respiratory infection, unspecified: Secondary | ICD-10-CM | POA: Diagnosis not present

## 2018-05-25 DIAGNOSIS — R6883 Chills (without fever): Secondary | ICD-10-CM

## 2018-05-25 LAB — POC INFLUENZA A&B (BINAX/QUICKVUE)
Influenza A, POC: NEGATIVE
Influenza B, POC: NEGATIVE

## 2018-05-25 LAB — POCT CBC
Granulocyte percent: 59.4 % (ref 37–80)
HCT, POC: 38.2 % (ref 29–41)
Hemoglobin: 12.6 g/dL (ref 11–14.6)
Lymph, poc: 1.3 (ref 0.6–3.4)
MCH, POC: 28.5 pg (ref 27–31.2)
MCHC: 33.1 g/dL (ref 31.8–35.4)
MCV: 86.3 fL (ref 76–111)
MID (cbc): 0.2 (ref 0–0.9)
MPV: 8.3 fL (ref 0–99.8)
POC Granulocyte: 2.2 (ref 2–6.9)
POC LYMPH PERCENT: 34.6 % (ref 10–50)
POC MID %: 6 %M (ref 0–12)
Platelet Count, POC: 217 10*3/uL (ref 142–424)
RBC: 4.43 M/uL (ref 4.04–5.48)
RDW, POC: 13.6 %
WBC: 3.7 10*3/uL — AB (ref 4.6–10.2)

## 2018-05-25 NOTE — Patient Instructions (Addendum)
You tested negative for the flu  Stay well hydrated. Get lost of rest. Wash your hands often. Drink enough water and fluids to keep your urine clear or pale yellow.   -Foods that can help speed recovery: honey, garlic, chicken soup, elderberries, green tea.  -Supplements that can help speed recovery: vitamin C, zinc, elderberry extract, quercetin, ginseng, selenium -Supplement with prebiotics and probiotics.   For sore throat: ? Gargle with 8 oz of salt water ( tsp of salt per 1 qt of water) as often as every 1-2 hours to soothe your throat.     try using a honey-based tea. Use 3 teaspoons of honey with juice squeezed from half lemon. Place shaved pieces of ginger into 1/2-1 cup of water and warm over stove top. Then mix the ingredients and repeat every 4 hours as needed.  Cough Syrup Recipe: Sweet Lemon & Honey Thyme  Ingredients . a handful of fresh thyme sprigs   . 1 pint of water (2 cups)  . 1/2 cup honey (raw is best, but regular will do)  . 1/2 lemon chopped Instructions 1. Place the lemon in the pint jar and cover with the honey. The honey will macerate the lemons and draw out liquids which taste so delicious! 2. Meanwhile, toss the thyme leaves into a saucepan and cover them with the water. 3. Bring the water to a gentle simmer and reduce it to half, about a cup of tea. 4. When the tea is reduced and cooled a bit, strain the sprigs & leaves, add it into the pint jar and stir it well. 5. Give it a shake and use a spoonful as needed. 6. Store your homemade cough syrup in the refrigerator for about a month.  Is there anything I can do on my own to get rid of my cough?  Yes. To help get rid of your cough, you can: ?Use a humidifier in your bedroom ?Use an over-the-counter cough medicine, or suck on cough drops or hard candy ?Stop smoking, if you smoke ?If you have allergies, avoid the things you are allergic to (like pollen, dust, animals, or mold) If you have acid reflux,  your doctor or nurse will tell you which lifestyle changes can help reduce symptoms.

## 2018-05-25 NOTE — Progress Notes (Signed)
Deanna [name]  MRN: [id number] DOB: May 26, 1970  PCP: Leafy Ro, MD  Subjective:  Pt is a 48 year old female who presents to clinic for fatigue x 5 days. Endorses cough x 5 days with runny nose and HA.  "feeling super hot" with occasional chills started five days ago and are off and on. Last "for a spell" for 1-2 minutes. Last episode was last night.   Endorses feeling "drained" for the past 5 days.  H/o seasonal allergies with cough.   Pt  has a past medical history of Allergy, BV (bacterial vaginosis), GERD (gastroesophageal reflux disease), HSV-1 infection, IBS (irritable bowel syndrome), Near syncope (02/21/2014), Ovarian cyst, Syncope (02/21/2014), and Yeast infection.  Review of Systems  Constitutional: Positive for fatigue. Negative for chills, diaphoresis and fever.  HENT: Negative for congestion, postnasal drip, rhinorrhea, sinus pressure, sinus pain and sore throat.   Respiratory: Positive for cough. Negative for shortness of breath and wheezing.   Neurological: Positive for headaches. Negative for dizziness.  Psychiatric/Behavioral: Negative for sleep disturbance.    Patient Active Problem List   Diagnosis Date Noted  . Postoperative anemia due to acute blood loss 07/10/2015  . Menorrhagia 07/09/2015  . Syncope 02/21/2014  . Near syncope 02/21/2014  . Fibroids 02/09/2012  . Dysmenorrhea 02/09/2012  . HSV-1 infection     Current Outpatient Medications on File Prior to Visit  Medication Sig Dispense Refill  . Ascorbic Acid (VITAMIN C GUMMIE PO) Take by mouth.    . Ascorbic Acid (VITAMIN C PO) Take 1 tablet by mouth daily.    . cetirizine (ZYRTEC) 10 MG tablet Take 10 mg by mouth daily.    . fluticasone (FLONASE) 50 MCG/ACT nasal spray Place 1 spray into both nostrils daily as needed for allergies or rhinitis.    Marland Kitchen ibuprofen (ADVIL,MOTRIN) 600 MG tablet 1  po  pc  every 6 hours for 5 days then as needed for pain 30 tablet 1  . MULTIPLE VITAMIN PO Take by  mouth.    . ondansetron (ZOFRAN) 4 MG tablet Take 1 tablet (4 mg total) by mouth every 8 (eight) hours as needed for nausea or vomiting. 20 tablet 0  . pantoprazole (PROTONIX) 40 MG tablet Take 40 mg by mouth daily.    . valACYclovir (VALTREX) 500 MG tablet valacyclovir 500 mg tablet    . VITAMIN E PO Take 1 capsule by mouth daily.    . ferrous sulfate (IRON SUPPLEMENT) 325 (65 FE) MG tablet Take 325 mg by mouth daily with breakfast.    . Multiple Vitamins-Minerals (ONE-A-DAY MENS VITACRAVES) CHEW Chew 1 tablet by mouth daily.     No current facility-administered medications on file prior to visit.     No Known Allergies   Objective:  BP 131/79   Pulse 65   Temp 98.5 F (36.9 C) (Oral)   Resp 20   Ht 5' 9.09" (1.755 m)   Wt 165 lb 3.2 oz (74.9 kg)   LMP 05/25/2015 (Approximate)   SpO2 100%   BMI 24.33 kg/m   Physical Exam Vitals signs reviewed.  Constitutional:      General: She is not in acute distress. HENT:     Right Ear: Tympanic membrane normal.     Left Ear: Tympanic membrane normal.     Nose: Mucosal edema present. No rhinorrhea.     Right Sinus: No maxillary sinus tenderness or frontal sinus tenderness.     Left Sinus: No maxillary sinus tenderness or frontal  sinus tenderness.  Cardiovascular:     Rate and Rhythm: Normal rate and regular rhythm.     Heart sounds: Normal heart sounds.  Pulmonary:     Effort: Pulmonary effort is normal. No respiratory distress.     Breath sounds: Normal breath sounds. No wheezing or rales.  Skin:    General: Skin is warm and dry.  Neurological:     Mental Status: She is alert and oriented to person, place, and time.  Psychiatric:        Judgment: Judgment normal.     Results for orders placed or performed in visit on 05/25/18  POCT CBC  Result Value Ref Range   WBC 3.7 (A) 4.6 - 10.2 K/uL   Lymph, poc 1.3 0.6 - 3.4   POC LYMPH PERCENT 34.6 10 - 50 %L   MID (cbc) 0.2 0 - 0.9   POC MID % 6.0 0 - 12 %M   POC Granulocyte  2.2 2 - 6.9   Granulocyte percent 59.4 37 - 80 %G   RBC 4.43 4.04 - 5.48 M/uL   Hemoglobin 12.6 11 - 14.6 g/dL   HCT, POC 38.2 29 - 41 %   MCV 86.3 76 - 111 fL   MCH, POC 28.5 27 - 31.2 pg   MCHC 33.1 31.8 - 35.4 g/dL   RDW, POC 13.6 %   Platelet Count, POC 217 142 - 424 K/uL   MPV 8.3 0 - 99.8 fL  POC Influenza A&B(BINAX/QUICKVUE)  Result Value Ref Range   Influenza A, POC Negative Negative   Influenza B, POC Negative Negative    Assessment and Plan :  1. Acute upper respiratory infection 2. Chills - Pt presents c/o chills and cough. Negative flu and WBC count wnl. Plan to treat supportively. RTC if symptoms worsen.  - POCT CBC - POC Influenza A&B(BINAX/QUICKVUE)   Mercer Pod, PA-C  [facility] 05/25/2018 10:34 AM  Please note: Portions of this report may have been transcribed using dragon voice recognition software. Every effort was made to ensure accuracy; however, inadvertent computerized transcription errors may be present.

## 2018-09-21 IMAGING — MR MR ABDOMEN WO/W CM
17 series · 48 of 48 positions shown · IV contrast (multihance)
Comparison: No prior abdominal MRI.

CLINICAL DATA: 47-year-old female with history of hematuria.
Complex cyst in the upper pole the right kidney incompletely
characterized on prior ultrasound examination. Follow-up study.

EXAM:
MRI ABDOMEN WITHOUT AND WITH CONTRAST
TECHNIQUE: Multiplanar multisequence MR imaging of the abdomen was performed
both before and after the administration of intravenous contrast.
CONTRAST:  15mL MULTIHANCE GADOBENATE DIMEGLUMINE 529 MG/ML IV SOLN

[Series 3: T2 · coronal · 5.0mm · 0.78mm/px · 1 of 33 slices shown (1 of 3)]
[im 1/33]
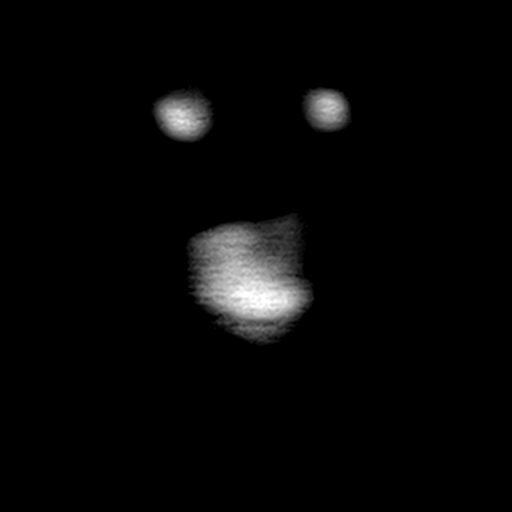

[Series 4: T2 · axial · 5.0mm · 0.78mm/px · 1 of 50 slices shown (2 of 3)]
[im 1/50]
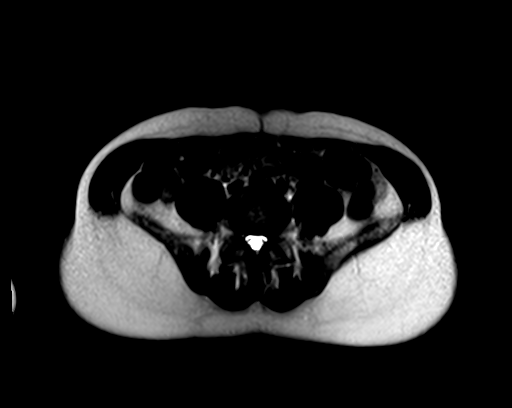

[Series 5: [id number]_500_[id number]_trig · axial · 6.0mm · 1.98mm/px · z∈[-113,+153]mm · 2 of 104 slices shown]
[im 1/104]
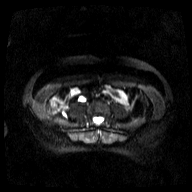
[im 104/104]
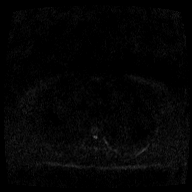

[Series 6: [id number]_[id number]_trig_adc · axial · 6.0mm · 1.98mm/px · 1 of 35 slices shown]
[im 1/35]
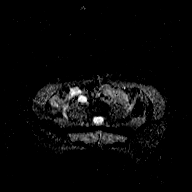

[Series 7: T1 · axial · 5.0mm · 0.78mm/px · z∈[-124,+170]mm · 2 of 100 slices shown]
[im 1/100]
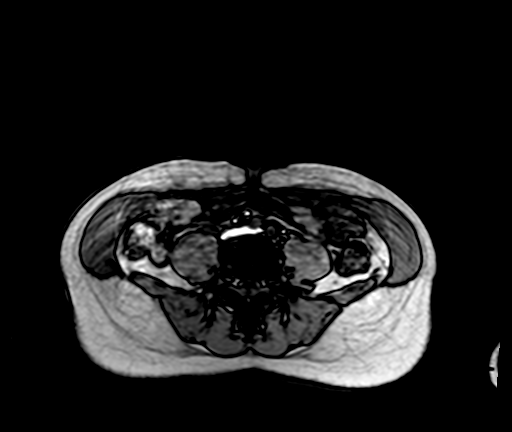
[im 100/100]
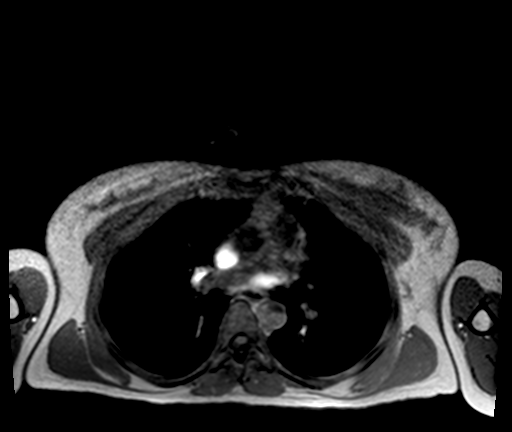

[Series 8: T2 · axial · 5.0mm · 1.56mm/px · 1 of 43 slices shown (3 of 3)]
[im 1/43]
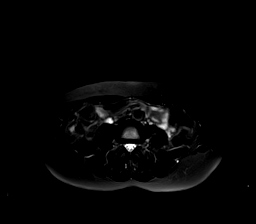

[Series 9: bSSFP · axial · 4.0mm · 0.78mm/px · z∈[-102,+154]mm · 2 of 65 slices shown]
[im 1/65]
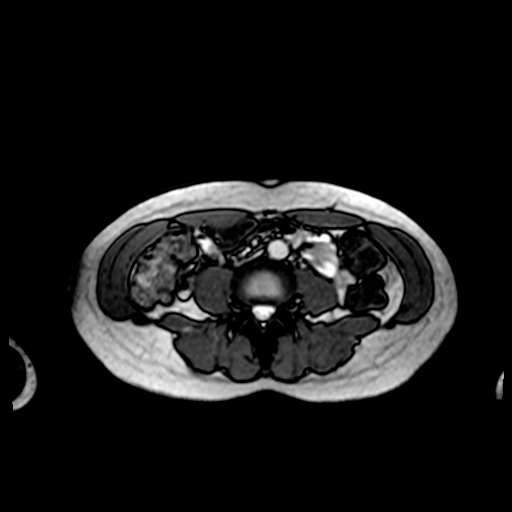
[im 65/65]
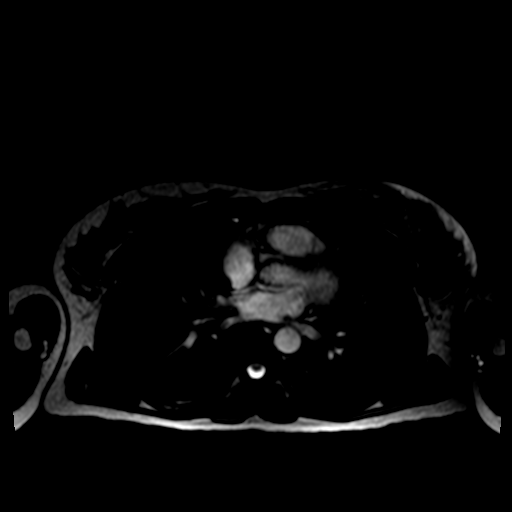

[Series 10: T1 dynamic · axial · non-contrast · 2.3mm · 1.56mm/px · z∈[-106,+168]mm · 4 of 120 slices shown]
[im 1/120]
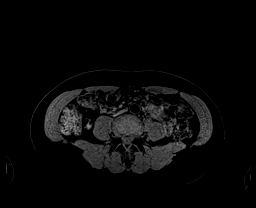
[im 40/120]
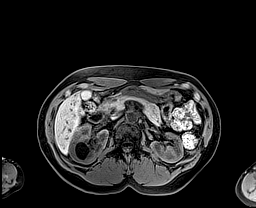
[im 80/120]
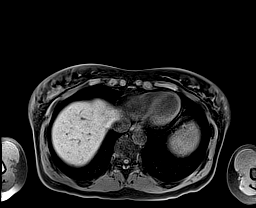
[im 120/120]
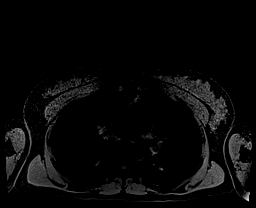

[Series 11: post 25 sec · axial · 2.3mm · 1.56mm/px · z∈[-106,+168]mm · 4 of 120 slices shown]
[im 1/120]
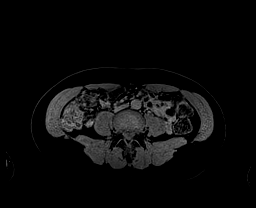
[im 40/120]
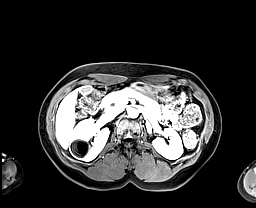
[im 80/120]
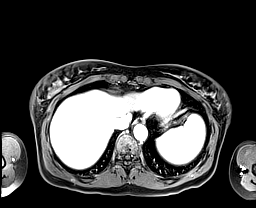
[im 120/120]
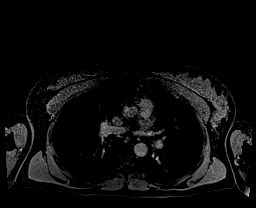

[Series 12: post 45 sec · axial · 2.3mm · 1.56mm/px · z∈[-106,+168]mm · 4 of 120 slices shown]
[im 1/120]
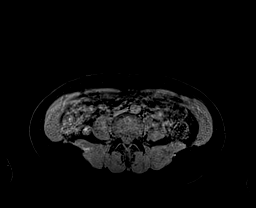
[im 40/120]
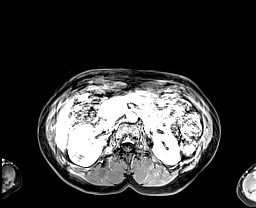
[im 80/120]
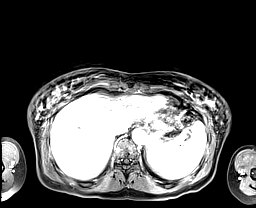
[im 120/120]
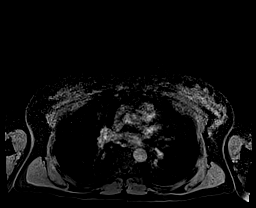

[Series 13: post 45 sec_sub · axial · 2.3mm · 1.56mm/px · z∈[-106,+168]mm · 4 of 120 slices shown]
[im 1/120]
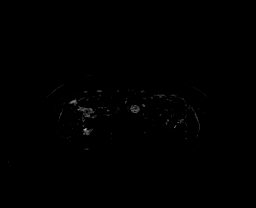
[im 40/120]
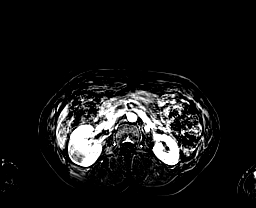
[im 80/120]
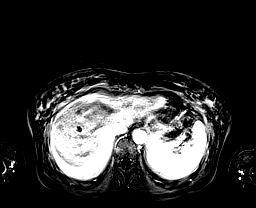
[im 120/120]
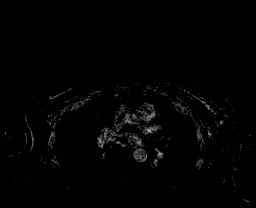

[Series 14: post 90 sec · axial · 2.3mm · 1.56mm/px · z∈[-106,+168]mm · 4 of 120 slices shown]
[im 1/120]
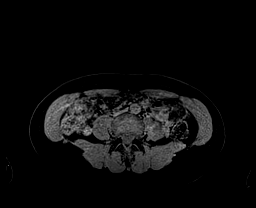
[im 40/120]
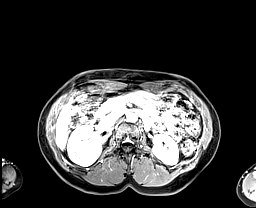
[im 80/120]
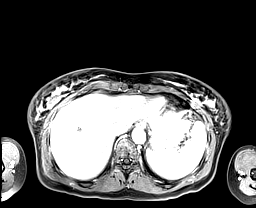
[im 120/120]
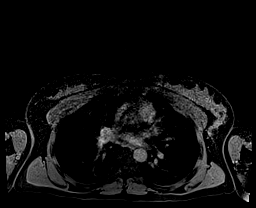

[Series 15: post 90 sec_sub · axial · 2.3mm · 1.56mm/px · z∈[-106,+168]mm · 4 of 120 slices shown]
[im 1/120]
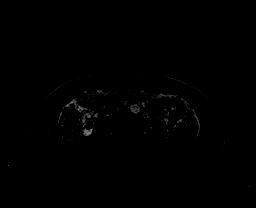
[im 40/120]
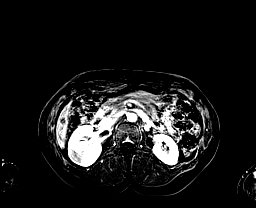
[im 80/120]
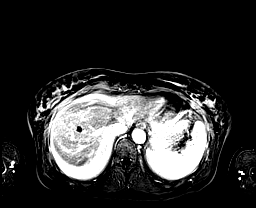
[im 120/120]
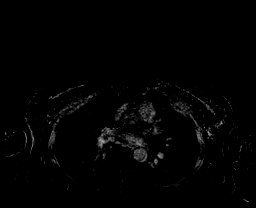

[Series 16: T1 dynamic post-contrast · coronal · 2.6mm · 0.64mm/px · 2 of 80 slices shown]
[im 1/80]
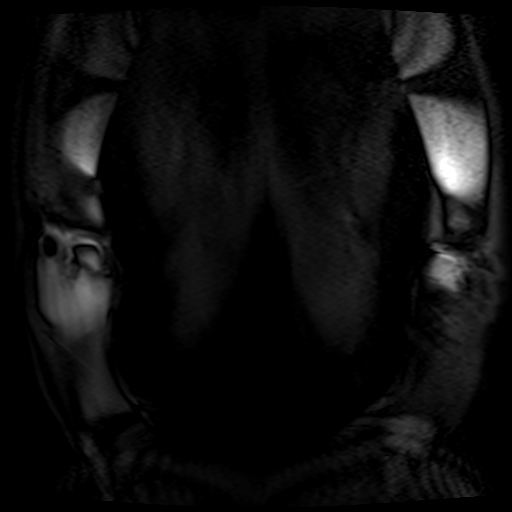
[im 80/80]
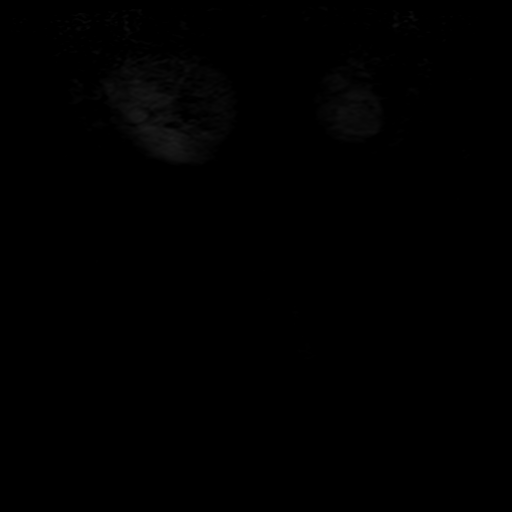

[Series 17: post axial 3+ · axial · 2.3mm · 1.56mm/px · z∈[-106,+168]mm · 4 of 120 slices shown (1 of 2)]
[im 1/120]
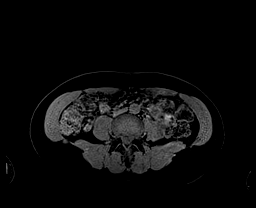
[im 40/120]
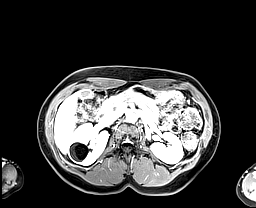
[im 80/120]
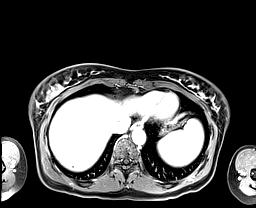
[im 120/120]
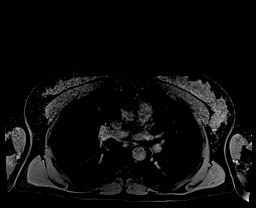

[Series 18: post axial 3+ · axial · 2.3mm · 1.56mm/px · z∈[-106,+168]mm · 4 of 120 slices shown (2 of 2)]
[im 1/120]
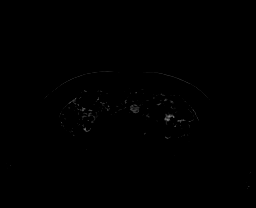
[im 40/120]
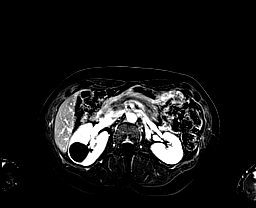
[im 80/120]
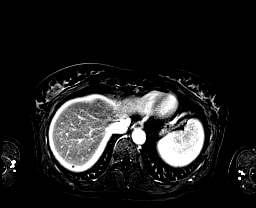
[im 120/120]
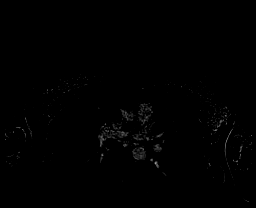

[Series 20: sub_25 sec post · axial · 2.3mm · 1.56mm/px · z∈[-106,+168]mm · 4 of 120 slices shown]
[im 1/120]
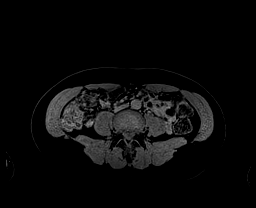
[im 40/120]
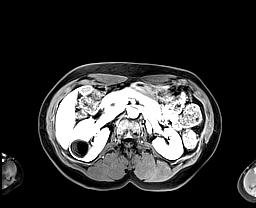
[im 80/120]
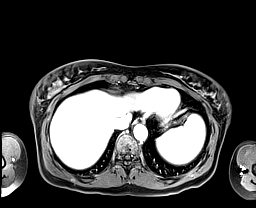
[im 120/120]
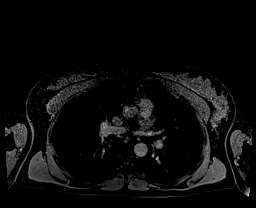

[48 of 48 positions shown; findings below may reference images not displayed]

Abdominal ultrasound abdominal
ultrasound 09/14/2017. CT the abdomen and pelvis 11/12/2015.
FINDINGS: Lower chest: Unremarkable.

Hepatobiliary: Multiple small T1 hypointense, T2 hyperintense,
nonenhancing lesions scattered throughout the liver measuring 1 cm
or less in size, compatible with tiny cysts and/or biliary
hamartomas. No other larger more suspicious appearing hepatic
lesions. No intra or extrahepatic biliary ductal dilatation.
Intermediate signal intensity diffusely throughout the gallbladder,
indicative of biliary sludge. No findings to suggest an acute
cholecystitis at this time.

Pancreas: No pancreatic mass. No pancreatic ductal dilatation. No
pancreatic or peripancreatic fluid or inflammatory changes.

Spleen:  Unremarkable.

Adrenals/Urinary Tract: 2.9 cm T1 hypointense, T2 hyperintense and
nonenhancing lesion in the interpolar region of the right kidney is
compatible with a simple cyst. Left kidney and bilateral adrenal
glands are normal in appearance. No hydroureteronephrosis.

Stomach/Bowel: Visualized portions are unremarkable.

Vascular/Lymphatic: No aneurysm identified in the visualized
abdominal vasculature. No lymphadenopathy noted in the abdomen.

Other: No significant volume of ascites noted in the visualized
portions of the abdomen.

Musculoskeletal: No aggressive appearing osseous lesions are noted
in the visualized portions of the skeleton.
IMPRESSION: 1. Right renal lesion is compatible with a simple cyst.
2. Multiple small simple cysts and/or biliary hamartomas noted
throughout the liver bilaterally.

## 2018-12-29 ENCOUNTER — Encounter: Payer: Self-pay | Admitting: Family Medicine

## 2018-12-29 ENCOUNTER — Ambulatory Visit (INDEPENDENT_AMBULATORY_CARE_PROVIDER_SITE_OTHER): Payer: 59 | Admitting: Family Medicine

## 2018-12-29 ENCOUNTER — Ambulatory Visit
Admission: RE | Admit: 2018-12-29 | Discharge: 2018-12-29 | Disposition: A | Discharge status: Home / Self Care | Payer: BLUE CROSS/BLUE SHIELD | Source: Ambulatory Visit | Attending: Family Medicine | Admitting: Family Medicine

## 2018-12-29 ENCOUNTER — Other Ambulatory Visit: Payer: Self-pay

## 2018-12-29 VITALS — BP 112/75 | HR 77 | Temp 98.0°F | Resp 16 | Wt 163.0 lb

## 2018-12-29 DIAGNOSIS — R29818 Other symptoms and signs involving the nervous system: Secondary | ICD-10-CM

## 2018-12-29 DIAGNOSIS — H538 Other visual disturbances: Secondary | ICD-10-CM | POA: Diagnosis not present

## 2018-12-29 DIAGNOSIS — F411 Generalized anxiety disorder: Secondary | ICD-10-CM

## 2018-12-29 DIAGNOSIS — R110 Nausea: Secondary | ICD-10-CM | POA: Diagnosis not present

## 2018-12-29 DIAGNOSIS — R200 Anesthesia of skin: Secondary | ICD-10-CM | POA: Diagnosis not present

## 2018-12-29 LAB — POCT CBC
Granulocyte percent: 2.9 %G — AB (ref 37–80)
HCT, POC: 38.5 % (ref 29–41)
Hemoglobin: 12.8 g/dL (ref 11–14.6)
Lymph, poc: 28.8 — AB (ref 0.6–3.4)
MCH, POC: 28.2 pg (ref 27–31.2)
MCHC: 33.3 g/dL (ref 31.8–35.4)
MCV: 84.7 fL (ref 76–111)
MID (cbc): 68.3 — AB (ref 0–0.9)
MPV: 8.3 fL (ref 0–99.8)
POC Granulocyte: 0.1 — AB (ref 2–6.9)
POC LYMPH PERCENT: 2.9 %L — AB (ref 10–50)
POC MID %: 1.2 %M (ref 0–12)
Platelet Count, POC: 223 10*3/uL (ref 142–424)
RBC: 4.55 M/uL (ref 4.04–5.48)
RDW, POC: 13.9 %
WBC: 4.3 10*3/uL — AB (ref 4.6–10.2)

## 2018-12-29 LAB — COMPREHENSIVE METABOLIC PANEL
ALT: 10 IU/L (ref 0–32)
AST: 14 IU/L (ref 0–40)
Albumin/Globulin Ratio: 1.8 (ref 1.2–2.2)
Albumin: 4.2 g/dL (ref 3.8–4.8)
Alkaline Phosphatase: 68 IU/L (ref 39–117)
BUN/Creatinine Ratio: 7 — ABNORMAL LOW (ref 9–23)
BUN: 5 mg/dL — ABNORMAL LOW (ref 6–24)
Bilirubin Total: 0.4 mg/dL (ref 0.0–1.2)
CO2: 26 mmol/L (ref 20–29)
Calcium: 9.2 mg/dL (ref 8.7–10.2)
Chloride: 104 mmol/L (ref 96–106)
Creatinine, Ser: 0.71 mg/dL (ref 0.57–1.00)
GFR calc Af Amer: 116 mL/min/{1.73_m2} (ref >59)
GFR calc non Af Amer: 100 mL/min/{1.73_m2} (ref >59)
Globulin, Total: 2.3 g/dL (ref 1.5–4.5)
Glucose: 92 mg/dL (ref 65–99)
Potassium: 4.4 mmol/L (ref 3.5–5.2)
Sodium: 136 mmol/L (ref 134–144)
Total Protein: 6.5 g/dL (ref 6.0–8.5)

## 2018-12-29 NOTE — Patient Instructions (Addendum)
No treatment will be given you at this time.  I recommend you go home and just take it easy today.  Your hemoglobin is excellent  You will be called today to have your CT scheduled for tomorrow.  In the event of getting acutely worse between now and then call 911 and go to the emergency room.  If you have lab work done today you will be contacted with your lab results within the next 2 weeks.  If you have not heard from [place] then please contact us. The fastest way to get your results is to register for My Chart.   IF you received an x-ray today, you will receive an invoice from [facility]. Please contact [facility] at [phone] with questions or concerns regarding your invoice.   IF you received labwork today, you will receive an invoice from [place]. Please contact [facility] at [phone] with questions or concerns regarding your invoice.   Our billing staff will not be able to assist you with questions regarding bills from these companies.  You will be contacted with the lab results as soon as they are available. The fastest way to get your results is to activate your My Chart account. Instructions are located on the last page of this paperwork. If you have not heard from [place] regarding the results in 2 weeks, please contact this office.

## 2018-12-29 NOTE — Progress Notes (Addendum)
Patient ID: Deanna [name], female    DOB: 1971-01-03  Age: 48 y.o. MRN: [id number]  Chief Complaint  Patient presents with  . Abdominal Pain    pt states she woke up yesterday with an upset stomach, no vomitting but nausea   . Numbness    pt states she has been having numbness in her left arm and also some unexpected bruising  . Blurred Vision    x 1 day she states she tried to get up she had to sit down.     Subjective:   48 year old lady who comes in today with concern regarding an episode that started yesterday.  She got up in the morning and went to her computer to begin work.  She had numbness develop in her left arm.  It was an abnormal sensation from the upper arm down to the hand.  No motor weakness, just a concerning sensation.  She left her computer to go to the bathroom and noticed that she was lightheaded and dizzy.  She went to the bed and lay down.  With this she also had some nausea.  She laid there are bad and the symptoms subsided except for the arm symptoms which have continue to persist.  She still has some abnormal sensation along the elbow on the left forearm.  She did not have any chest pain.  The nausea also subsided yesterday.  She is generally healthy.  She did have some problems a few years ago with persistent dizziness.  Ultimately the neurologist associated that with her anemia, and she had a partial hysterectomy.  A couple of weeks ago she had a spontaneous area of bruising behind her left leg just below the popliteal fossa.  As it subsided it turned more red than the expected blue.  It is gone now.  That was a spontaneous episode that she could not explain, having not had any trauma to the area.  Current allergies, medications, problem list, past/family and social histories reviewed.  Objective:  BP 112/75   Pulse 77   Temp 98 F (36.7 C) (Oral)   Resp 16   Wt 163 lb (73.9 kg)   LMP 05/25/2015 (Approximate)   SpO2 99%   BMI 24.01 kg/m   No major acute  distress.  Eyes PRL.  EOMs ems intact.  Fundi benign.  TMs normal.  Throat clear.  Neck supple without nodes.  No carotid bruits.  Chest clear.  Heart rate without murmur.  Motor strength good in her arms.  Finger-nose normal.  Sensory grossly intact.  Abdomen was soft without mass or tenderness.  Leg has nothing to be seen on it now.  Results for orders placed or performed in visit on 12/29/18  POCT CBC  Result Value Ref Range   WBC 4.3 (A) 4.6 - 10.2 K/uL   Lymph, poc 28.8 (A) 0.6 - 3.4   POC LYMPH PERCENT 2.9 (A) 10 - 50 %L   MID (cbc) 68.3 (A) 0 - 0.9   POC MID % 1.2 0 - 12 %M   POC Granulocyte 0.1 (A) 2 - 6.9   Granulocyte percent 2.9 (A) 37 - 80 %G   RBC 4.55 4.04 - 5.48 M/uL   Hemoglobin 12.8 11 - 14.6 g/dL   HCT, POC 38.5 29 - 41 %   MCV 84.7 76 - 111 fL   MCH, POC 28.2 27 - 31.2 pg   MCHC 33.3 31.8 - 35.4 g/dL   RDW, POC 13.9 %  Platelet Count, POC 223 142 - 424 K/uL   MPV 8.3 0 - 99.8 fL     Assessment & Plan:   Assessment: 1. Left arm numbness   2. Nausea   3. Vision blurring   4. Other symptoms and signs involving the nervous system   5. Anxiety state       Plan: Basically appears normal at this point, but still has that sensation in her arm.  I think we need to go ahead and do CT of her head.  We will check an EKG just to be on the safe side.  Blood work is ordered.  No medical treatment at this time.  We will just give her some time.  Orders Placed This Encounter  Procedures  . CT Head Wo Contrast    Standing Status:   Future    Standing Expiration Date:   03/29/2020    Order Specific Question:   ** REASON FOR EXAM (FREE TEXT)    Answer:   left arm numbness/abnormal sensation, blurred vision, nausea    Order Specific Question:   Is patient pregnant?    Answer:   No    Order Specific Question:   Preferred imaging location?    Answer:   External    Order Specific Question:   Radiology Contrast Protocol - do NOT remove file path    Answer:    [email or web address]  . Comprehensive metabolic panel  . Sedimentation Rate  . POCT CBC  . EKG 12-Lead    No orders of the defined types were placed in this encounter.   EKG is normal.     Patient Instructions   No treatment will be given you at this time.  I recommend you go home and just take it easy today.  Your hemoglobin is excellent  You will be called today to have your CT scheduled for tomorrow.  In the event of getting acutely worse between now and then call 911 and go to the emergency room.  If you have lab work done today you will be contacted with your lab results within the next 2 weeks.  If you have not heard from [place] then please contact us. The fastest way to get your results is to register for My Chart.   IF you received an x-ray today, you will receive an invoice from [facility]. Please contact [facility] at [phone] with questions or concerns regarding your invoice.   IF you received labwork today, you will receive an invoice from [place]. Please contact [facility] at [phone] with questions or concerns regarding your invoice.   Our billing staff will not be able to assist you with questions regarding bills from these companies.  You will be contacted with the lab results as soon as they are available. The fastest way to get your results is to activate your My Chart account. Instructions are located on the last page of this paperwork. If you have not heard from [place] regarding the results in 2 weeks, please contact this office.         Return in about 2 weeks (around 01/12/2019), or Needs to be established with a regular provider, for Follow-up with left arm numbness..  4: 25 PM call patient.  CT scan and lab work done.  Reassurance.  Recheck if persists. Ruben Reason, MD 12/29/2018

## 2018-12-30 LAB — SEDIMENTATION RATE: Sed Rate: 11 mm/hr (ref 0–32)

## 2019-01-13 ENCOUNTER — Encounter: Payer: Self-pay | Admitting: Family Medicine

## 2019-01-13 ENCOUNTER — Ambulatory Visit (INDEPENDENT_AMBULATORY_CARE_PROVIDER_SITE_OTHER): Payer: 59 | Admitting: Family Medicine

## 2019-01-13 ENCOUNTER — Other Ambulatory Visit: Payer: Self-pay

## 2019-01-13 VITALS — BP 135/83 | HR 66 | Temp 98.4°F | Ht 69.0 in | Wt 164.4 lb

## 2019-01-13 DIAGNOSIS — R899 Unspecified abnormal finding in specimens from other organs, systems and tissues: Secondary | ICD-10-CM | POA: Diagnosis not present

## 2019-01-13 DIAGNOSIS — R200 Anesthesia of skin: Secondary | ICD-10-CM

## 2019-01-13 LAB — POCT CBC
Granulocyte percent: 64.7 %G (ref 37–80)
HCT, POC: 37.9 % (ref 29–41)
Hemoglobin: 12.5 g/dL (ref 11–14.6)
Lymph, poc: 1.5 (ref 0.6–3.4)
MCH, POC: 27.5 pg (ref 27–31.2)
MCHC: 33.1 g/dL (ref 31.8–35.4)
MCV: 83.1 fL (ref 76–111)
MID (cbc): 0.3 (ref 0–0.9)
MPV: 8.4 fL (ref 0–99.8)
POC Granulocyte: 3.2 (ref 2–6.9)
POC LYMPH PERCENT: 29.6 %L (ref 10–50)
POC MID %: 5.7 %M (ref 0–12)
Platelet Count, POC: 218 10*3/uL (ref 142–424)
RBC: 4.56 M/uL (ref 4.04–5.48)
RDW, POC: 13.6 %
WBC: 4.9 10*3/uL (ref 4.6–10.2)

## 2019-01-13 NOTE — Progress Notes (Signed)
Patient ID: Deanna [name], female    DOB: 1970-06-09  Age: 48 y.o. MRN: [id number]  Chief Complaint  Patient presents with  . left arm numbness    getting better    Subjective:   Patient is here for a follow-up with regard to the visit of 2 weeks ago.  The arm numbness has gone away.  She says today there are no symptoms.  She does not know exactly when the went away but she just has noticed that they are nonexistent now.  She had no further complications.  She did have the CT scan done which was negative.  She works for [employer], sometimes seated and sometimes standing at her desk.  She does carry a burden but no new particular problems or stresses.  Current allergies, medications, problem list, past/family and social histories reviewed.  Objective:  BP 135/83 (BP Location: Right Arm, Patient Position: Sitting, Cuff Size: Normal)   Pulse 66   Temp 98.4 F (36.9 C) (Oral)   Ht 5\' 9"  (1.753 m)   Wt 164 lb 6.4 oz (74.6 kg)   LMP 05/25/2015 (Approximate)   SpO2 100%   BMI 24.28 kg/m   No gross sensation abnormalities of the left arm.  Motor strength is good.  Finger-nose normal.  Denies any headache or dizziness.  Labs reviewed.  Everything looks good except for some of the calculated values on the CBC were a little different than they have usually been on her so I am going to repeat it.  Assessment & Plan:   Assessment: 1. Arm numbness left   2. Abnormal laboratory test result       Plan: CBC.  Otherwise reassurance.  She will return or contact us if she has further problems.  Discussed with her that somatic symptoms can do these things also.  Orders Placed This Encounter  Procedures  . POCT CBC    No orders of the defined types were placed in this encounter.        Patient Instructions     Reports all look good except for a mildly abnormal CBC which will be repeated.  Return if further problems.  If you have lab work done today you will be contacted with your  lab results within the next 2 weeks.  If you have not heard from [place] then please contact us. The fastest way to get your results is to register for My Chart.   IF you received an x-ray today, you will receive an invoice from [facility]. Please contact [facility] at [phone] with questions or concerns regarding your invoice.   IF you received labwork today, you will receive an invoice from [place]. Please contact [facility] at [phone] with questions or concerns regarding your invoice.   Our billing staff will not be able to assist you with questions regarding bills from these companies.  You will be contacted with the lab results as soon as they are available. The fastest way to get your results is to activate your My Chart account. Instructions are located on the last page of this paperwork. If you have not heard from [place] regarding the results in 2 weeks, please contact this office.         Return if symptoms worsen or fail to improve.   Ruben Reason, MD 01/13/2019

## 2019-01-13 NOTE — Patient Instructions (Addendum)
   Reports all look good except for a mildly abnormal CBC which will be repeated.  Return if further problems.  If you have lab work done today you will be contacted with your lab results within the next 2 weeks.  If you have not heard from [place] then please contact us. The fastest way to get your results is to register for My Chart.   IF you received an x-ray today, you will receive an invoice from [facility]. Please contact [facility] at [phone] with questions or concerns regarding your invoice.   IF you received labwork today, you will receive an invoice from [place]. Please contact [facility] at [phone] with questions or concerns regarding your invoice.   Our billing staff will not be able to assist you with questions regarding bills from these companies.  You will be contacted with the lab results as soon as they are available. The fastest way to get your results is to activate your My Chart account. Instructions are located on the last page of this paperwork. If you have not heard from [place] regarding the results in 2 weeks, please contact this office.

## 2019-04-18 ENCOUNTER — Telehealth (INDEPENDENT_AMBULATORY_CARE_PROVIDER_SITE_OTHER): Payer: Managed Care, Other (non HMO) | Admitting: Family Medicine

## 2019-04-18 ENCOUNTER — Other Ambulatory Visit: Payer: Self-pay

## 2019-04-18 ENCOUNTER — Encounter: Payer: Self-pay | Admitting: Family Medicine

## 2019-04-18 VITALS — Temp 96.8°F

## 2019-04-18 DIAGNOSIS — R232 Flushing: Secondary | ICD-10-CM | POA: Diagnosis not present

## 2019-04-18 NOTE — Progress Notes (Signed)
Virtual Visit Note  I connected with patient on 04/18/19 at 510pm by phone and verified that I am speaking with the correct person using two identifiers. Deanna [name] is currently located at home and patient is currently with them during visit. The provider, Rutherford Guys, MD is located in their office at time of visit.  I discussed the limitations, risks, security and privacy concerns of performing an evaluation and management service by telephone and the availability of in person appointments. I also discussed with the patient that there may be a patient responsible charge related to this service. The patient expressed understanding and agreed to proceed.   CC: hot flashes  HPI  Last night started having stomach cramps and getting hot "like I was burning up", her entire body feels hot. Stomach cramps have resolved but continues to feel hot and cold Feels "sick" when she gets hot Had normal BM this morning, no diarrhea or constipation No nausea or vomiting No back pain, dysuria, vaginal discharge No sore throat or ear pain No cough or SOB No loss of taste or smell Having allergies Temp: 97.2, 96.8, 96.7, 98.6 She has never experienced this before Wondering if she is entering menopause?  Had supracervical hysterectomy in 2017 for menorrhagia, ovaries were not removed  No Known Allergies  Prior to Admission medications   Medication Sig Start Date End Date Taking? Authorizing Provider  acyclovir (ZOVIRAX) 200 MG capsule  01/05/19  Yes [provider]  cetirizine (ZYRTEC) 10 MG tablet Take 10 mg by mouth daily.   Yes [provider]  ELDERBERRY PO Take by mouth.   Yes [provider]  ibuprofen (ADVIL,MOTRIN) 600 MG tablet 1  po  pc  every 6 hours for 5 days then as needed for pain 07/10/15  Yes Florene Glen, [place], PA-C  Multiple Vitamins-Minerals (HAIR SKIN AND NAILS FORMULA) TABS Take by mouth.   Yes [provider]  Multiple Vitamins-Minerals  (WOMENS 50+ MULTI VITAMIN/MIN) TABS Take by mouth.   Yes [provider]  pantoprazole (PROTONIX) 40 MG tablet Take 40 mg by mouth daily.   Yes [provider]  raNITIdine HCl (RANITIDINE 150 MAX STRENGTH PO) Take by mouth.   Yes [provider]  fluticasone (FLONASE) 50 MCG/ACT nasal spray Place 1 spray into both nostrils daily as needed for allergies or rhinitis.    [provider]    Past Medical History:  Diagnosis Date  . Allergy   . BV (bacterial vaginosis)   . GERD (gastroesophageal reflux disease)   . HSV-1 infection   . IBS (irritable bowel syndrome)   . Near syncope 02/21/2014  . Ovarian cyst   . Syncope 02/21/2014  . Yeast infection     Past Surgical History:  Procedure Laterality Date  . BREAST BIOPSY  2008  . LAPAROSCOPY  1991   no endometriosis found  . SUPRACERVICAL ABDOMINAL HYSTERECTOMY N/A 07/09/2015   Procedure: HYSTERECTOMY SUPRACERVICAL ABDOMINAL with Bilateral Salpingectomy;  Surgeon: Eldred Manges, MD;  Location: [facility];  Service: Gynecology;  Laterality: N/A;    Social History   Tobacco Use  . Smoking status: Never Smoker  . Smokeless tobacco: Never Used  Substance Use Topics  . Alcohol use: Yes    Alcohol/week: 0.0 standard drinks    Comment: wine occassional    Family History  Problem Relation Age of Onset  . Heart disease Maternal Grandmother   . Hypertension Maternal Grandmother   . Anemia Mother   . Throat  cancer Paternal Uncle   . Cancer Paternal Uncle        unsure ?   Marland Kitchen Sickle cell trait Cousin   . Cancer Maternal Aunt     ROS Per hpi  Objective  Vitals as reported by the patient: none   ASSESSMENT and PLAN  1. Hot flashes New onset of 1 day. Associated with GI sx. Discussed too soon to identify cause, discussed monitoring and RTC precautions. If menopause related, briefly discussed treatment options.   FOLLOW-UP: prn   The above assessment and management plan was discussed with the  patient. The patient verbalized understanding of and has agreed to the management plan. Patient is aware to call the clinic if symptoms persist or worsen. Patient is aware when to return to the clinic for a follow-up visit. Patient educated on when it is appropriate to go to the emergency department.    I provided 17 minutes of non-face-to-face time during this encounter.  Rutherford Guys, MD [facility], [place] [address] Ph.  [phone] Fax [phone]

## 2019-04-18 NOTE — Patient Instructions (Signed)
° ° ° °  If you have lab work done today you will be contacted with your lab results within the next 2 weeks.  If you have not heard from us then please contact us. The fastest way to get your results is to register for My Chart. ° ° °IF you received an x-ray today, you will receive an invoice from [facility]. Please contact [facility] at [phone] with questions or concerns regarding your invoice.  ° °IF you received labwork today, you will receive an invoice from [facility]. Please contact [facility] at [phone] with questions or concerns regarding your invoice.  ° °Our billing staff will not be able to assist you with questions regarding bills from these companies. ° °You will be contacted with the lab results as soon as they are available. The fastest way to get your results is to activate your My Chart account. Instructions are located on the last page of this paperwork. If you have not heard from us regarding the results in 2 weeks, please contact this office. °  ° ° ° °

## 2019-04-18 NOTE — Progress Notes (Signed)
Pt states that she thinks she is going through lady changes and wants to get it checked out.  Sysmptoms include stomach cramps and hot flashes all night last night. Woke up this am and stomach cramps are gone, but she still has hot flashes (feels like she is burning up every so often).

## 2020-05-21 ENCOUNTER — Other Ambulatory Visit: Payer: Self-pay

## 2020-05-21 ENCOUNTER — Ambulatory Visit (HOSPITAL_COMMUNITY)
Admission: RE | Admit: 2020-05-21 | Discharge: 2020-05-21 | Disposition: A | Discharge status: Home / Self Care | Payer: No Typology Code available for payment source | Source: Ambulatory Visit | Attending: Urgent Care | Admitting: Urgent Care

## 2020-05-21 ENCOUNTER — Encounter (HOSPITAL_COMMUNITY): Payer: Self-pay

## 2020-05-21 VITALS — BP 122/76 | HR 80 | Temp 98.3°F | Resp 16

## 2020-05-21 DIAGNOSIS — R059 Cough, unspecified: Secondary | ICD-10-CM

## 2020-05-21 DIAGNOSIS — R519 Headache, unspecified: Secondary | ICD-10-CM | POA: Diagnosis not present

## 2020-05-21 DIAGNOSIS — R0982 Postnasal drip: Secondary | ICD-10-CM | POA: Insufficient documentation

## 2020-05-21 DIAGNOSIS — J3089 Other allergic rhinitis: Secondary | ICD-10-CM | POA: Diagnosis not present

## 2020-05-21 DIAGNOSIS — Z9079 Acquired absence of other genital organ(s): Secondary | ICD-10-CM | POA: Insufficient documentation

## 2020-05-21 DIAGNOSIS — Z79899 Other long term (current) drug therapy: Secondary | ICD-10-CM | POA: Diagnosis not present

## 2020-05-21 DIAGNOSIS — Z20822 Contact with and (suspected) exposure to covid-19: Secondary | ICD-10-CM | POA: Insufficient documentation

## 2020-05-21 DIAGNOSIS — R051 Acute cough: Secondary | ICD-10-CM | POA: Insufficient documentation

## 2020-05-21 LAB — SARS CORONAVIRUS 2 (TAT 6-24 HRS): SARS Coronavirus 2: NEGATIVE

## 2020-05-21 MED ORDER — PROMETHAZINE-DM 6.25-15 MG/5ML PO SYRP: 5.0000 mL | ORAL_SOLUTION | Freq: Every evening | ORAL | 0 refills | Status: DC | PRN

## 2020-05-21 MED ORDER — PREDNISONE 20 MG PO TABS: ORAL_TABLET | ORAL | 0 refills | Status: DC

## 2020-05-21 MED ORDER — BENZONATATE 100 MG PO CAPS: 100.0000 mg | ORAL_CAPSULE | Freq: Three times a day (TID) | ORAL | 0 refills | Status: DC | PRN

## 2020-05-21 NOTE — ED Provider Notes (Signed)
[facility]   MRN: [id number] DOB: 19-Sep-1970  Subjective:   Deanna [name] is a 50 y.o. female presenting for 1 week fatigue, headaches, nausea without vomiting, persistent cough and need to clear her throat. Feels like she had a common cold.  Has used over-the-counter medication sparingly.  Has a history of allergies.  Has had COVID vaccination.  Denies chest pain, shortness of breath, body aches.  She feels like she is improved overall but still has this lingering drainage, need to clear her throat and cough.  No current facility-administered medications for this encounter.  Current Outpatient Medications:  .  acyclovir (ZOVIRAX) 200 MG capsule, , Disp: , Rfl:  .  cetirizine (ZYRTEC) 10 MG tablet, Take 10 mg by mouth daily., Disp: , Rfl:  .  ELDERBERRY PO, Take by mouth., Disp: , Rfl:  .  fluticasone (FLONASE) 50 MCG/ACT nasal spray, Place 1 spray into both nostrils daily as needed for allergies or rhinitis., Disp: , Rfl:  .  ibuprofen (ADVIL,MOTRIN) 600 MG tablet, 1  po  pc  every 6 hours for 5 days then as needed for pain, Disp: 30 tablet, Rfl: 1 .  Multiple Vitamins-Minerals (HAIR SKIN AND NAILS FORMULA) TABS, Take by mouth., Disp: , Rfl:  .  Multiple Vitamins-Minerals (WOMENS 50+ MULTI VITAMIN/MIN) TABS, Take by mouth., Disp: , Rfl:  .  pantoprazole (PROTONIX) 40 MG tablet, Take 40 mg by mouth daily., Disp: , Rfl:  .  raNITIdine HCl (RANITIDINE 150 MAX STRENGTH PO), Take by mouth., Disp: , Rfl:    No Known Allergies  Past Medical History:  Diagnosis Date  . Allergy   . BV (bacterial vaginosis)   . GERD (gastroesophageal reflux disease)   . HSV-1 infection   . IBS (irritable bowel syndrome)   . Near syncope 02/21/2014  . Ovarian cyst   . Syncope 02/21/2014  . Yeast infection      Past Surgical History:  Procedure Laterality Date  . BREAST BIOPSY  2008  . LAPAROSCOPY  1991   no endometriosis found  . SUPRACERVICAL ABDOMINAL HYSTERECTOMY N/A 07/09/2015    Procedure: HYSTERECTOMY SUPRACERVICAL ABDOMINAL with Bilateral Salpingectomy;  Surgeon: Eldred Manges, MD;  Location: [facility];  Service: Gynecology;  Laterality: N/A;    Family History  Problem Relation Age of Onset  . Heart disease Maternal Grandmother   . Hypertension Maternal Grandmother   . Anemia Mother   . Throat cancer Paternal Uncle   . Cancer Paternal Uncle        unsure ?   Marland Kitchen Sickle cell trait Cousin   . Cancer Maternal Aunt     Social History   Tobacco Use  . Smoking status: Never Smoker  . Smokeless tobacco: Never Used  Vaping Use  . Vaping Use: Never used  Substance Use Topics  . Alcohol use: Yes    Alcohol/week: 0.0 standard drinks    Comment: wine occassional  . Drug use: No    ROS   Objective:   Vitals: BP 122/76 (BP Location: Right Arm)   Pulse 80   Temp 98.3 F (36.8 C) (Oral)   Resp 16   LMP 05/25/2015 (Approximate)   SpO2 100%   Physical Exam Constitutional:      General: She is not in acute distress.    Appearance: Normal appearance. She is well-developed. She is not ill-appearing, toxic-appearing or diaphoretic.  HENT:     Head: Normocephalic and atraumatic.     Nose: Nose normal.  Mouth/Throat:     Mouth: Mucous membranes are moist.     Pharynx: No oropharyngeal exudate or posterior oropharyngeal erythema.     Comments: Significant streaks of post-nasal drainage.  Eyes:     General: No scleral icterus.       Right eye: No discharge.        Left eye: No discharge.     Extraocular Movements: Extraocular movements intact.     Pupils: Pupils are equal, round, and reactive to light.  Cardiovascular:     Rate and Rhythm: Normal rate and regular rhythm.     Pulses: Normal pulses.     Heart sounds: Normal heart sounds. No murmur heard. No friction rub. No gallop.   Pulmonary:     Effort: Pulmonary effort is normal. No respiratory distress.     Breath sounds: Normal breath sounds. No stridor. No wheezing, rhonchi or rales.   Skin:    General: Skin is warm and dry.     Findings: No rash.  Neurological:     Mental Status: She is alert and oriented to person, place, and time.  Psychiatric:        Mood and Affect: Mood normal. Mood is not anxious or depressed.        Behavior: Behavior normal. Behavior is not agitated.        Thought Content: Thought content normal.     Assessment and Plan :   PDMP not reviewed this encounter.  1. Cough   2. Post-nasal drainage   3. Allergic rhinitis due to other allergic trigger, unspecified seasonality     In light of patient's difficult to control allergies, recommended prednisone course, restarting Zyrtec and otherwise doing supportive care.  COVID-19 testing pending. Counseled patient on potential for adverse effects with medications prescribed/recommended today, ER and return-to-clinic precautions discussed, patient verbalized understanding.    Jaynee Eagles, [place] 05/21/20 1219

## 2020-05-21 NOTE — ED Triage Notes (Signed)
Pt c/o nausea, headaches and fatigue X 1 week. Pt states she seemed to have a common cold last week. She states she had a cough that was hard to get rid of.

## 2020-05-21 NOTE — Discharge Instructions (Addendum)
We will notify you of your COVID-19 test results as they arrive and may take between 24 to 48 hours.  I encourage you to sign up for MyChart if you have not already done so as this can be the easiest way for [place] to communicate results to you online or through a phone app.  In the meantime, if you develop worsening symptoms including fever, chest pain, shortness of breath despite our current treatment plan then please report to the emergency room as this may be a sign of worsening status from possible COVID-19 infection.  Otherwise, we will manage this as a viral syndrome. For sore throat or cough try using a honey-based tea. Use 3 teaspoons of honey with juice squeezed from half lemon. Place shaved pieces of ginger into 1/2-1 cup of water and warm over stove top. Then mix the ingredients and repeat every 4 hours as needed. Please take Tylenol 500mg -650mg  every 6 hours for aches and pains, fevers. Hydrate very well with at least 2 liters of water. Eat light meals such as soups to replenish electrolytes and soft fruits, veggies. Start an antihistamine like Zyrtec, Allegra or Claritin for postnasal drainage, sinus congestion.  You can take this together with pseudoephedrine (Sudafed) at a dose of 60 mg 2-3 times a day as needed for the same kind of congestion.  You should take either prednisone or Sudafed but not both.

## 2022-01-05 ENCOUNTER — Ambulatory Visit
Admission: RE | Admit: 2022-01-05 | Discharge: 2022-01-05 | Disposition: A | Discharge status: Home / Self Care | Payer: No Typology Code available for payment source | Source: Ambulatory Visit | Attending: Emergency Medicine | Admitting: Emergency Medicine

## 2022-01-05 ENCOUNTER — Other Ambulatory Visit: Payer: Self-pay

## 2022-01-05 ENCOUNTER — Ambulatory Visit (INDEPENDENT_AMBULATORY_CARE_PROVIDER_SITE_OTHER): Payer: No Typology Code available for payment source

## 2022-01-05 VITALS — BP 128/73 | HR 67 | Temp 98.0°F | Resp 18

## 2022-01-05 DIAGNOSIS — M533 Sacrococcygeal disorders, not elsewhere classified: Secondary | ICD-10-CM | POA: Diagnosis not present

## 2022-01-05 DIAGNOSIS — R8290 Unspecified abnormal findings in urine: Secondary | ICD-10-CM | POA: Diagnosis not present

## 2022-01-05 DIAGNOSIS — Z20822 Contact with and (suspected) exposure to covid-19: Secondary | ICD-10-CM | POA: Insufficient documentation

## 2022-01-05 DIAGNOSIS — R1031 Right lower quadrant pain: Secondary | ICD-10-CM | POA: Diagnosis not present

## 2022-01-05 DIAGNOSIS — M5430 Sciatica, unspecified side: Secondary | ICD-10-CM

## 2022-01-05 DIAGNOSIS — R110 Nausea: Secondary | ICD-10-CM | POA: Insufficient documentation

## 2022-01-05 DIAGNOSIS — M5440 Lumbago with sciatica, unspecified side: Secondary | ICD-10-CM

## 2022-01-05 LAB — POCT URINALYSIS DIP (MANUAL ENTRY)
Bilirubin, UA: NEGATIVE
Blood, UA: NEGATIVE
Glucose, UA: NEGATIVE mg/dL
Ketones, POC UA: NEGATIVE mg/dL
Leukocytes, UA: NEGATIVE
Nitrite, UA: NEGATIVE
Protein Ur, POC: NEGATIVE mg/dL
Spec Grav, UA: 1.02 (ref 1.010–1.025)
Urobilinogen, UA: 1 E.U./dL
pH, UA: 7.5 (ref 5.0–8.0)

## 2022-01-05 MED ORDER — CIPROFLOXACIN HCL 250 MG PO TABS: 250.0000 mg | ORAL_TABLET | Freq: Two times a day (BID) | ORAL | 0 refills | Status: AC

## 2022-01-05 NOTE — ED Triage Notes (Signed)
Complains of nausea over Saturday and Sunday.  Complains of right lower abdominal pain.  Patient also complains of urine smelling foul-has discussed this with provider, but reports urine is ok.

## 2022-01-05 NOTE — Discharge Instructions (Addendum)
You were tested for COVID-19 today.  This is a PCR test.  The result of your COVID-19 test will be posted to your MyChart once it is complete, typically this takes 6 to 12 hours.   If your COVID-19 test is positive you will be contacted by phone.  Because you do not have a history of being immune compromised, you are currently vaccinated for COVID-19, you are under the age of 10, you do not have a risk of severe disease due to COVID-19.  Antiviral treatment is not indicated.  The urinalysis that we performed in the clinic today was normal.  Urine culture will be performed because you are having symptoms of urinary tract infection.  The result of the urine culture will be available in the next 3 to 5 days and will be posted to your MyChart account.  If there is an abnormal finding, you will be contacted by phone and advised of further treatment recommendations, if any.   You were advised to begin antibiotics today because you are having active symptoms of an acute lower urinary tract infection also known as cystitis.  It is very important that you take all doses exactly as prescribed.  Incomplete antibiotic therapy can cause worsening urinary tract infection that can become aggressive, escape from urinary tract into your bloodstream causing sepsis which will require hospitalization.   Please pick up and begin taking your prescription for Ciprofloxacin 250 mg as soon as possible.  Please take all doses exactly as prescribed.  You can take this medication with or without food.  This medication is safe to take with your other medications.   The x-ray of your lumbar spine was concerning for some sacroiliac joint inflammation.  Your sacroiliac joint is the joint that connects your sacrum to your hips.  The joint itself does not articulate but does provide some flexibility between the spine and the hips.  You appear to have some arthritic changes and inflammation in both joints which I believe is causing your  intermittent episodes of numbness and pain in your lower legs.  Treatment for this is supportive with ice, rest and anti-inflammatory pain medications such as ibuprofen or Aleve.  If you find that these are not helpful, please follow-up with your regular provider for further pain management recommendations.  If you have not had complete resolution of your symptoms after completing treatment as prescribed, please return to urgent care for repeat evaluation or follow-up with your primary care provider.

## 2022-01-05 NOTE — ED Provider Notes (Signed)
[facility]    CSN: [id number] Arrival date & time: 01/05/22  1701    HISTORY   Chief Complaint  Patient presents with   Nausea    I'm not sure is any of this is related to the nausea... having lower right abdominal pain (w/strong strange odor from urine), left leg numbness down into into toes. - Entered by patient   Appointment    17:00   HPI Deanna [name] is a pleasant, 51 y.o. female who presents to urgent care today. Patient complains of feeling nauseated for the past 2 days.  Patient states she also has pain in her right lower abdomen and foul-smelling urine.  Patient endorses abnormal odor of urine, increased frequency of urination, increased urge to urinate, and sensation of incomplete emptying.  Patient denies suprapubic pain, perineal pain, left-sided flank pain, right-sided flank pain, fever, chills, malaise, rigors, significant fatigue, abnormal vaginal discharge, vaginal itching, vaginal irritation, dyspareunia, genital lesion(s), and possible exposure to STD.  Patient states she has talked with her regular provider at the [place] about this before but they tell her that her urine is normal.  Patient states that she has had some numbness going down her left leg and sometimes has a sharp pain in her right lower back, does not know if this is related.  The history is provided by the patient.   Past Medical History:  Diagnosis Date   Allergy    BV (bacterial vaginosis)    GERD (gastroesophageal reflux disease)    HSV-1 infection    IBS (irritable bowel syndrome)    Near syncope 02/21/2014   Ovarian cyst    Syncope 02/21/2014   Yeast infection    Patient Active Problem List   Diagnosis Date Noted   Postoperative anemia due to acute blood loss 07/10/2015   Menorrhagia 07/09/2015   Syncope 02/21/2014   Near syncope 02/21/2014   Fibroids 02/09/2012   Dysmenorrhea 02/09/2012   HSV-1 infection    Past Surgical History:  Procedure Laterality Date   BREAST BIOPSY   2008   LAPAROSCOPY  1991   no endometriosis found   SUPRACERVICAL ABDOMINAL HYSTERECTOMY N/A 07/09/2015   Procedure: HYSTERECTOMY SUPRACERVICAL ABDOMINAL with Bilateral Salpingectomy;  Surgeon: Eldred Manges, MD;  Location: [facility];  Service: Gynecology;  Laterality: N/A;   OB History     Gravida  3   Para  1   Term      Preterm      AB  2   Living  1      SAB  1   IAB  1   Ectopic      Multiple      Live Births             Home Medications    Prior to Admission medications   Medication Sig Start Date End Date Taking? Authorizing Provider  acyclovir (ZOVIRAX) 200 MG capsule  01/05/19   [provider]  benzonatate (TESSALON) 100 MG capsule Take 1-2 capsules (100-200 mg total) by mouth 3 (three) times daily as needed. 05/21/20   Jaynee Eagles, PA-C  cetirizine (ZYRTEC) 10 MG tablet Take 10 mg by mouth daily.    [provider]  ELDERBERRY PO Take by mouth.    [provider]  fluticasone (FLONASE) 50 MCG/ACT nasal spray Place 1 spray into both nostrils daily as needed for allergies or rhinitis.    [provider]  ibuprofen (ADVIL,MOTRIN) 600 MG tablet 1  po  pc  every 6 hours for 5 days then as needed for pain 07/10/15   Earnstine Regal, PA-C  Multiple Vitamins-Minerals (HAIR SKIN AND NAILS FORMULA) TABS Take by mouth.    [provider]  Multiple Vitamins-Minerals (WOMENS 50+ MULTI VITAMIN/MIN) TABS Take by mouth.    [provider]  pantoprazole (PROTONIX) 40 MG tablet Take 40 mg by mouth daily.    [provider]  predniSONE (DELTASONE) 20 MG tablet Take 2 tablets daily with breakfast. 05/21/20   Jaynee Eagles, PA-C  promethazine-dextromethorphan (PROMETHAZINE-DM) 6.25-15 MG/5ML syrup Take 5 mLs by mouth at bedtime as needed for cough. 05/21/20   Jaynee Eagles, PA-C  raNITIdine HCl (RANITIDINE 150 MAX STRENGTH PO) Take by mouth.    [provider]    Family History Family History  Problem Relation  Age of Onset   Heart disease Maternal Grandmother    Hypertension Maternal Grandmother    Anemia Mother    Throat cancer Paternal Uncle    Cancer Paternal Uncle        unsure ?    Sickle cell trait Cousin    Cancer Maternal Aunt    Social History Social History   Tobacco Use   Smoking status: Never   Smokeless tobacco: Never  Vaping Use   Vaping Use: Never used  Substance Use Topics   Alcohol use: Yes    Alcohol/week: 0.0 standard drinks of alcohol    Comment: wine occassional   Drug use: No   Allergies   Patient has no known allergies.  Review of Systems Review of Systems Pertinent findings revealed after performing a 14 point review of systems has been noted in the history of present illness.  Physical Exam Triage Vital Signs ED Triage Vitals  Enc Vitals Group     BP 02/14/21 0827 (!) 147/82     Pulse Rate 02/14/21 0827 72     Resp 02/14/21 0827 18     Temp 02/14/21 0827 98.3 F (36.8 C)     Temp Source 02/14/21 0827 Oral     SpO2 02/14/21 0827 98 %     Weight --      Height --      Head Circumference --      Peak Flow --      Pain Score 02/14/21 0826 5     Pain Loc --      Pain Edu? --      Excl. in [place]? --   No data found.  Updated Vital Signs BP 128/73 (BP Location: Right Arm)   Pulse 67   Temp 98 F (36.7 C) (Oral)   Resp 18   LMP 05/25/2015 (Approximate)   SpO2 97%   Physical Exam Vitals and nursing note reviewed.  Constitutional:      General: She is not in acute distress.    Appearance: Normal appearance. She is not ill-appearing.  HENT:     Head: Normocephalic and atraumatic.  Eyes:     General: Lids are normal.        Right eye: No discharge.        Left eye: No discharge.     Extraocular Movements: Extraocular movements intact.     Conjunctiva/sclera: Conjunctivae normal.     Right eye: Right conjunctiva is not injected.     Left eye: Left conjunctiva is not injected.  Neck:     Trachea: Trachea and phonation normal.   Cardiovascular:     Rate and Rhythm: Normal rate and  regular rhythm.     Pulses: Normal pulses.     Heart sounds: Normal heart sounds. No murmur heard.    No friction rub. No gallop.  Pulmonary:     Effort: Pulmonary effort is normal. No accessory muscle usage, prolonged expiration or respiratory distress.     Breath sounds: Normal breath sounds. No stridor, decreased air movement or transmitted upper airway sounds. No decreased breath sounds, wheezing, rhonchi or rales.  Chest:     Chest wall: No tenderness.  Abdominal:     General: Abdomen is flat. Bowel sounds are normal. There is no distension.     Palpations: Abdomen is soft.     Tenderness: There is no abdominal tenderness. There is no right CVA tenderness or left CVA tenderness.     Hernia: No hernia is present.  Musculoskeletal:        General: Normal range of motion.     Cervical back: Normal range of motion and neck supple.     Comments: Right SI joint tenderness  Lymphadenopathy:     Cervical: No cervical adenopathy.  Skin:    General: Skin is warm and dry.     Findings: No erythema or rash.  Neurological:     General: No focal deficit present.     Mental Status: She is alert and oriented to person, place, and time.  Psychiatric:        Mood and Affect: Mood normal.        Behavior: Behavior normal.     Visual Acuity Right Eye Distance:   Left Eye Distance:   Bilateral Distance:    Right Eye Near:   Left Eye Near:    Bilateral Near:     UC Couse / Diagnostics / Procedures:     Radiology No results found.  Procedures Procedures (including critical care time) EKG  Pending results:  Labs Reviewed  SARS CORONAVIRUS 2 BY RT PCR  URINE CULTURE  POCT URINALYSIS DIP (MANUAL ENTRY)  POCT URINALYSIS DIP (MANUAL ENTRY)    Medications Ordered in UC: Medications - No data to display  UC Diagnoses / Final Clinical Impressions(s)   I have reviewed the triage vital signs and the nursing notes.  Pertinent  labs & imaging results that were available during my care of the patient were reviewed by me and considered in my medical decision making (see chart for details).    Final diagnoses:  Nausea without vomiting  Malodorous urine  Sciatic nerve pain, unspecified laterality  Low back pain with sciatica, sciatica laterality unspecified, unspecified back pain laterality, unspecified chronicity  Sacroiliac joint dysfunction of both sides    {LMSTDP:27058}  {LMUTIP:27060}  ED Prescriptions     Medication Sig Dispense Auth. Provider   ciprofloxacin (CIPRO) 250 MG tablet Take 1 tablet (250 mg total) by mouth 2 (two) times daily for 5 days. 10 tablet Lynden Oxford Scales, PA-C      PDMP not reviewed this encounter.  Disposition Upon Discharge:  Condition: stable for discharge home  Patient presented with concern for an acute illness with associated systemic symptoms and significant discomfort requiring urgent management. In my opinion, this is a condition that a prudent lay person (someone who possesses an average knowledge of health and medicine) may potentially expect to result in complications if not addressed urgently such as respiratory distress, impairment of bodily function or dysfunction of bodily organs.   As such, the patient has been evaluated and assessed, work-up was performed and treatment was provided in  alignment with urgent care protocols and evidence based medicine.  Patient/parent/caregiver has been advised that the patient may require follow up for further testing and/or treatment if the symptoms continue in spite of treatment, as clinically indicated and appropriate.  Routine symptom specific, illness specific and/or disease specific instructions were discussed with the patient and/or caregiver at length.  Prevention strategies for avoiding STD exposure were also discussed.  The patient will follow up with their current PCP if and as advised. If the patient does not  currently have a PCP we will assist them in obtaining one.   The patient may need specialty follow up if the symptoms continue, in spite of conservative treatment and management, for further workup, evaluation, consultation and treatment as clinically indicated and appropriate.  Patient/parent/caregiver verbalized understanding and agreement of plan as discussed.  All questions were addressed during visit.  Please see discharge instructions below for further details of plan.  Discharge Instructions:   Discharge Instructions      You were tested for COVID-19 today.  This is a PCR test.  The result of your COVID-19 test will be posted to your MyChart once it is complete, typically this takes 6 to 12 hours.   If your COVID-19 test is positive you will be contacted by phone.  Because you do not have a history of being immune compromised, you are currently vaccinated for COVID-19, you are under the age of 68, you do not have a risk of severe disease due to COVID-19.  Antiviral treatment is not indicated.  The urinalysis that we performed in the clinic today was normal.  Urine culture will be performed because you are having symptoms of urinary tract infection.  The result of the urine culture will be available in the next 3 to 5 days and will be posted to your MyChart account.  If there is an abnormal finding, you will be contacted by phone and advised of further treatment recommendations, if any.   You were advised to begin antibiotics today because you are having active symptoms of an acute lower urinary tract infection also known as cystitis.  It is very important that you take all doses exactly as prescribed.  Incomplete antibiotic therapy can cause worsening urinary tract infection that can become aggressive, escape from urinary tract into your bloodstream causing sepsis which will require hospitalization.   Please pick up and begin taking your prescription for Ciprofloxacin 250 mg as soon as  possible.  Please take all doses exactly as prescribed.  You can take this medication with or without food.  This medication is safe to take with your other medications.   The x-ray of your lumbar spine was concerning for some sacroiliac joint inflammation.  Your sacroiliac joint is the joint that connects your sacrum to your hips.  The joint itself does not articulate but does provide some flexibility between the spine and the hips.  You appear to have some arthritic changes and inflammation in both joints which I believe is causing your intermittent episodes of numbness and pain in your lower legs.  Treatment for this is supportive with ice, rest and anti-inflammatory pain medications such as ibuprofen or Aleve.  If you find that these are not helpful, please follow-up with your regular provider for further pain management recommendations.  If you have not had complete resolution of your symptoms after completing treatment as prescribed, please return to urgent care for repeat evaluation or follow-up with your primary care provider.  This office note has been dictated using Museum/gallery curator.  Unfortunately, this method of dictation can sometimes lead to typographical or grammatical errors.  I apologize for your inconvenience in advance if this occurs.  Please do not hesitate to reach out to me if clarification is needed.

## 2022-01-06 LAB — SARS CORONAVIRUS 2 BY RT PCR: SARS Coronavirus 2 by RT PCR: NEGATIVE

## 2022-01-07 LAB — URINE CULTURE
Culture: <10000 — AB
Special Requests: NORMAL

## 2022-03-09 ENCOUNTER — Ambulatory Visit
Admission: RE | Admit: 2022-03-09 | Discharge: 2022-03-09 | Disposition: A | Discharge status: Home / Self Care | Payer: No Typology Code available for payment source | Source: Ambulatory Visit | Attending: Physician Assistant | Admitting: Physician Assistant

## 2022-03-09 VITALS — BP 153/87 | HR 70 | Temp 97.5°F | Resp 16

## 2022-03-09 DIAGNOSIS — R002 Palpitations: Secondary | ICD-10-CM

## 2022-03-09 DIAGNOSIS — R222 Localized swelling, mass and lump, trunk: Secondary | ICD-10-CM | POA: Diagnosis not present

## 2022-03-09 DIAGNOSIS — M546 Pain in thoracic spine: Secondary | ICD-10-CM

## 2022-03-09 NOTE — ED Triage Notes (Signed)
Pt c/o severe back pain x several months ago. States last night started having chest pain. States the back pain was between shoulder blades and chest pain radiated under left arm. Also talks about a mass on the back wants provider to view b/c it may have changed shape and may need a MRI. Cannot see PCP un end of jan and "I'm not satisfied with that" ans needs someone to see now. *DENIES PAIN IN TRIAGE*

## 2022-03-09 NOTE — ED Provider Notes (Signed)
[facility]    CSN: [id number] Arrival date & time: 03/09/22  1609      History   Chief Complaint Chief Complaint  Patient presents with   Back Pain    Back and Chest pain. A seriously pain started about two months ago, lingering not going away. As of last night I'm also now having chest pains. All occurring on my left side just below my left shoulder blade. ALso concerned about the mass on my back. - Entered by patient    HPI Deanna [name] is a 51 y.o. female.   Patient here today for evaluation of thoracic back pain that started about 2 months ago.  She states that she has a mass on her back and noted that it had changed in shape.  She denies tenderness to the mass.  She states that she has now started to have some radiating pain around to her left chest.  She states last night she had some palpitations.  She denies any chest pain or back pain today in office.  She has not had any shortness of breath.  The history is provided by the patient.  Back Pain Associated symptoms: chest pain   Associated symptoms: no abdominal pain and no fever     Past Medical History:  Diagnosis Date   Allergy    BV (bacterial vaginosis)    GERD (gastroesophageal reflux disease)    HSV-1 infection    IBS (irritable bowel syndrome)    Near syncope 02/21/2014   Ovarian cyst    Syncope 02/21/2014   Yeast infection     Patient Active Problem List   Diagnosis Date Noted   Postoperative anemia due to acute blood loss 07/10/2015   Menorrhagia 07/09/2015   Syncope 02/21/2014   Near syncope 02/21/2014   Fibroids 02/09/2012   Dysmenorrhea 02/09/2012   HSV-1 infection     Past Surgical History:  Procedure Laterality Date   BREAST BIOPSY  2008   LAPAROSCOPY  1991   no endometriosis found   SUPRACERVICAL ABDOMINAL HYSTERECTOMY N/A 07/09/2015   Procedure: HYSTERECTOMY SUPRACERVICAL ABDOMINAL with Bilateral Salpingectomy;  Surgeon: Eldred Manges, MD;  Location: [facility];   Service: Gynecology;  Laterality: N/A;    OB History     Gravida  3   Para  1   Term      Preterm      AB  2   Living  1      SAB  1   IAB  1   Ectopic      Multiple      Live Births               Home Medications    Prior to Admission medications   Medication Sig Start Date End Date Taking? Authorizing Provider  cetirizine (ZYRTEC) 10 MG tablet Take 10 mg by mouth daily.    [provider]  ELDERBERRY PO Take by mouth.    [provider]  fluticasone (FLONASE) 50 MCG/ACT nasal spray Place 1 spray into both nostrils daily as needed for allergies or rhinitis.    [provider]  ibuprofen (ADVIL,MOTRIN) 600 MG tablet 1  po  pc  every 6 hours for 5 days then as needed for pain 07/10/15   Earnstine Regal, PA-C  Multiple Vitamins-Minerals (HAIR SKIN AND NAILS FORMULA) TABS Take by mouth.    [provider]  Multiple Vitamins-Minerals (WOMENS 50+ MULTI VITAMIN/MIN) TABS Take by mouth.  [provider]  pantoprazole (PROTONIX) 40 MG tablet Take 40 mg by mouth daily.    [provider]  raNITIdine HCl (RANITIDINE 150 MAX STRENGTH PO) Take by mouth.    [provider]    Family History Family History  Problem Relation Age of Onset   Heart disease Maternal Grandmother    Hypertension Maternal Grandmother    Anemia Mother    Throat cancer Paternal Uncle    Cancer Paternal Uncle        unsure ?    Sickle cell trait Cousin    Cancer Maternal Aunt     Social History Social History   Tobacco Use   Smoking status: Never   Smokeless tobacco: Never  Vaping Use   Vaping Use: Never used  Substance Use Topics   Alcohol use: Yes    Alcohol/week: 0.0 standard drinks of alcohol    Comment: wine occassional   Drug use: No     Allergies   Patient has no known allergies.   Review of Systems Review of Systems  Constitutional:  Negative for chills and fever.  Eyes:  Negative for discharge and  redness.  Respiratory:  Negative for shortness of breath.   Cardiovascular:  Positive for chest pain.  Gastrointestinal:  Negative for abdominal pain, nausea and vomiting.  Musculoskeletal:  Positive for back pain.  Skin:  Negative for color change and wound.     Physical Exam Triage Vital Signs ED Triage Vitals  Enc Vitals Group     BP 03/09/22 1620 (!) 153/87     Pulse Rate 03/09/22 1619 70     Resp 03/09/22 1619 16     Temp 03/09/22 1619 (!) 97.5 F (36.4 C)     Temp Source 03/09/22 1619 Oral     SpO2 03/09/22 1619 99 %     Weight --      Height --      Head Circumference --      Peak Flow --      Pain Score 03/09/22 1619 0     Pain Loc --      Pain Edu? --      Excl. in [place]? --    No data found.  Updated Vital Signs BP (!) 153/87   Pulse 70   Temp (!) 97.5 F (36.4 C) (Oral)   Resp 16   LMP 05/25/2015 (Approximate)   SpO2 99%      Physical Exam Vitals and nursing note reviewed.  Constitutional:      General: She is not in acute distress.    Appearance: Normal appearance. She is not ill-appearing.  HENT:     Head: Normocephalic and atraumatic.     Nose: Nose normal. No congestion or rhinorrhea.  Eyes:     Conjunctiva/sclera: Conjunctivae normal.  Cardiovascular:     Rate and Rhythm: Normal rate and regular rhythm.  Pulmonary:     Effort: Pulmonary effort is normal. No respiratory distress.     Breath sounds: Normal breath sounds. No wheezing, rhonchi or rales.  Musculoskeletal:     Comments: Minimal tenderness palpation noted around approximately 4 cm round compressible mass to the left thoracic back, no associated erythema, no wound present, no tenderness palpation to mass directly  Neurological:     Mental Status: She is alert.  Psychiatric:        Mood and Affect: Mood normal.        Behavior: Behavior normal.  Thought Content: Thought content normal.      UC Treatments / Results  Labs (all labs ordered are listed, but only abnormal  results are displayed) Labs Reviewed  CBC WITH DIFFERENTIAL/PLATELET  COMPREHENSIVE METABOLIC PANEL    EKG   Radiology No results found.  Procedures Procedures (including critical care time)  Medications Ordered in UC Medications - No data to display  Initial Impression / Assessment and Plan / UC Course  I have reviewed the triage vital signs and the nursing notes.  Pertinent labs & imaging results that were available during my care of the patient were reviewed by me and considered in my medical decision making (see chart for details).    I discussed that I suspect that mass is actually a lipoma given presentation, and that in urgent care setting we are unable to perform imaging needed for further evaluation.  Discussed treatment with steroid but patient would rather defer until she has better answers regarding etiology.  EKG ordered to rule out cardiac arrhythmia, EKG with normal sinus rhythm.  Discussed she may benefit from Holter monitor given intermittent symptoms of palpitations.  Patient made appointment with primary care in about 2 weeks.  Encouraged sooner follow-up with any further concerns.  Advised emergency room evaluation with any worsening chest pain, worsening palpitations or other concerns while awaiting PCP visit.  Final Clinical Impressions(s) / UC Diagnoses   Final diagnoses:  Palpitations  Acute left-sided thoracic back pain  Mass on back   Discharge Instructions   None    ED Prescriptions   None    PDMP not reviewed this encounter.   Francene Finders, PA-C 03/09/22 [phone]

## 2022-03-10 LAB — COMPREHENSIVE METABOLIC PANEL
ALT: 12 IU/L (ref 0–32)
AST: 16 IU/L (ref 0–40)
Albumin/Globulin Ratio: 1.9 (ref 1.2–2.2)
Albumin: 5.4 g/dL — ABNORMAL HIGH (ref 3.8–4.9)
Alkaline Phosphatase: 122 IU/L — ABNORMAL HIGH (ref 44–121)
BUN/Creatinine Ratio: 9 (ref 9–23)
BUN: 7 mg/dL (ref 6–24)
Bilirubin Total: 0.4 mg/dL (ref 0.0–1.2)
CO2: 25 mmol/L (ref 20–29)
Calcium: 10.1 mg/dL (ref 8.7–10.2)
Chloride: 102 mmol/L (ref 96–106)
Creatinine, Ser: 0.75 mg/dL (ref 0.57–1.00)
Globulin, Total: 2.9 g/dL (ref 1.5–4.5)
Glucose: 75 mg/dL (ref 70–99)
Potassium: 4.1 mmol/L (ref 3.5–5.2)
Sodium: 141 mmol/L (ref 134–144)
Total Protein: 8.3 g/dL (ref 6.0–8.5)
eGFR: 96 mL/min/{1.73_m2} (ref >59)

## 2022-03-10 LAB — CBC WITH DIFFERENTIAL/PLATELET
Basophils Absolute: 0.1 10*3/uL (ref 0.0–0.2)
Basos: 1 %
EOS (ABSOLUTE): 0.1 10*3/uL (ref 0.0–0.4)
Eos: 2 %
Hematocrit: 45 % (ref 34.0–46.6)
Hemoglobin: 14.6 g/dL (ref 11.1–15.9)
Immature Grans (Abs): 0 10*3/uL (ref 0.0–0.1)
Immature Granulocytes: 0 %
Lymphocytes Absolute: 1.3 10*3/uL (ref 0.7–3.1)
Lymphs: 33 %
MCH: 28.1 pg (ref 26.6–33.0)
MCHC: 32.4 g/dL (ref 31.5–35.7)
MCV: 87 fL (ref 79–97)
Monocytes Absolute: 0.2 10*3/uL (ref 0.1–0.9)
Monocytes: 6 %
Neutrophils Absolute: 2.3 10*3/uL (ref 1.4–7.0)
Neutrophils: 58 %
Platelets: 223 10*3/uL (ref 150–450)
RBC: 5.19 x10E6/uL (ref 3.77–5.28)
RDW: 12.4 % (ref 11.7–15.4)
WBC: 4 10*3/uL (ref 3.4–10.8)

## 2022-03-25 ENCOUNTER — Encounter: Payer: Self-pay | Admitting: Nurse Practitioner

## 2022-03-25 ENCOUNTER — Ambulatory Visit (INDEPENDENT_AMBULATORY_CARE_PROVIDER_SITE_OTHER): Payer: No Typology Code available for payment source | Admitting: Nurse Practitioner

## 2022-03-25 VITALS — BP 114/74 | HR 79 | Temp 97.9°F | Ht 68.0 in | Wt 173.4 lb

## 2022-03-25 DIAGNOSIS — G8929 Other chronic pain: Secondary | ICD-10-CM

## 2022-03-25 DIAGNOSIS — M546 Pain in thoracic spine: Secondary | ICD-10-CM | POA: Diagnosis not present

## 2022-03-25 DIAGNOSIS — E538 Deficiency of other specified B group vitamins: Secondary | ICD-10-CM | POA: Diagnosis not present

## 2022-03-25 DIAGNOSIS — E559 Vitamin D deficiency, unspecified: Secondary | ICD-10-CM

## 2022-03-25 DIAGNOSIS — R002 Palpitations: Secondary | ICD-10-CM

## 2022-03-25 DIAGNOSIS — S91302A Unspecified open wound, left foot, initial encounter: Secondary | ICD-10-CM | POA: Diagnosis not present

## 2022-03-25 DIAGNOSIS — Z13220 Encounter for screening for lipoid disorders: Secondary | ICD-10-CM | POA: Diagnosis not present

## 2022-03-25 DIAGNOSIS — Z124 Encounter for screening for malignant neoplasm of cervix: Secondary | ICD-10-CM

## 2022-03-25 NOTE — Patient Instructions (Signed)
It was great to see you!  We are checking your labs today and will let you know the results via mychart/phone.   I have ordered a MRI of your back.   Let's follow-up in 3 months, sooner if you have concerns.  If a referral was placed today, you will be contacted for an appointment. Please note that routine referrals can sometimes take up to 3-4 weeks to process. Please call our office if you haven't heard anything after this time frame.  Take care,  Vance Peper, NP

## 2022-03-25 NOTE — Progress Notes (Unsigned)
New Patient Visit  BP 114/74 (BP Location: Left Arm, Patient Position: Sitting, Cuff Size: Large)   Pulse 79   Temp 97.9 F (36.6 C) (Temporal)   Ht '5\' 8"'$  (1.727 m)   Wt 173 lb 6.4 oz (78.7 kg)   LMP 05/25/2015 (Approximate)   SpO2 98%   BMI 26.37 kg/m    Subjective:    Patient ID: Deanna [name], female    DOB: 17-Jan-1971, 51 y.o.   MRN: [id number]  CC: Chief Complaint  Patient presents with   Establish Care    Back pain x 2 months began to have sharp pain mid left back that shoots to abdomen. Lypoma found by back doctor on xray. Would like a MRI. Referral for OBGYN pap and mammogram    HPI: Deanna [name] is a 51 y.o. female presents for new patient visit to establish care.  Introduced to Designer, jewellery role and practice setting.  All questions answered.  Discussed provider/patient relationship and expectations.  About 2 months ago, she started feeling a sharp, piercing pain, in her left thoracic back that radiated around her side to her ribs. She was having pain with laying, sitting, turning, and driving. She went to a chiropractor and had 6 sessions. She also used heat at home which really helped her symptoms. The pain is starting to feel better. She does not remember any injury.   She states that felts like her heart did an extra beat starting 2 months ago. Then last night, she woke up and felt like her heart was palpitation and pounding. She had an EKG at an urgent care on 03/09/22. She drinks caffeine once in a while. She hasn't been exercising since her back started hurting 2 months ago.     Past Medical History:  Diagnosis Date   Allergy    BV (bacterial vaginosis)    GERD (gastroesophageal reflux disease)    HSV-1 infection    IBS (irritable bowel syndrome)    Near syncope 02/21/2014   Ovarian cyst    Syncope 02/21/2014   Yeast infection     Past Surgical History:  Procedure Laterality Date   BREAST BIOPSY  2008   LAPAROSCOPY  1991   no endometriosis  found   SUPRACERVICAL ABDOMINAL HYSTERECTOMY N/A 07/09/2015   Procedure: HYSTERECTOMY SUPRACERVICAL ABDOMINAL with Bilateral Salpingectomy;  Surgeon: Eldred Manges, MD;  Location: [facility];  Service: Gynecology;  Laterality: N/A;    Family History  Problem Relation Age of Onset   Heart disease Maternal Grandmother    Hypertension Maternal Grandmother    Anemia Mother    Throat cancer Paternal Uncle    Cancer Paternal Uncle        unsure ?    Sickle cell trait Cousin    Cancer Maternal Aunt      Social History   Tobacco Use   Smoking status: Never    Passive exposure: Never   Smokeless tobacco: Never  Vaping Use   Vaping Use: Never used  Substance Use Topics   Alcohol use: Yes    Alcohol/week: 0.0 standard drinks of alcohol    Comment: wine occassional   Drug use: No    Current Outpatient Medications on File Prior to Visit  Medication Sig Dispense Refill   cetirizine (ZYRTEC) 10 MG tablet Take 10 mg by mouth daily.     Multiple Vitamins-Minerals (HAIR SKIN AND NAILS FORMULA) TABS Take by mouth.     Multiple Vitamins-Minerals (WOMENS 50+ MULTI VITAMIN/MIN) TABS  Take by mouth.     UNABLE TO FIND Med Name: OTC Vitamin D3     UNABLE TO FIND Med Name: Vitamin B12     ELDERBERRY PO Take by mouth. (Patient not taking: Reported on 03/25/2022)     fluticasone (FLONASE) 50 MCG/ACT nasal spray Place 1 spray into both nostrils daily as needed for allergies or rhinitis. (Patient not taking: Reported on 03/25/2022)     ibuprofen (ADVIL,MOTRIN) 600 MG tablet 1  po  pc  every 6 hours for 5 days then as needed for pain (Patient not taking: Reported on 03/25/2022) 30 tablet 1   pantoprazole (PROTONIX) 40 MG tablet Take 40 mg by mouth daily. (Patient not taking: Reported on 03/25/2022)     raNITIdine HCl (RANITIDINE 150 MAX STRENGTH PO) Take by mouth. (Patient not taking: Reported on 03/25/2022)     No current facility-administered medications on file prior to visit.     Review of Systems   Constitutional:  Positive for fatigue. Negative for fever.  HENT:  Positive for ear pain and sinus pressure. Negative for congestion and sore throat.   Eyes: Negative.   Respiratory: Negative.    Cardiovascular:  Positive for palpitations. Negative for chest pain.  Gastrointestinal: Negative.   Genitourinary: Negative.   Musculoskeletal:  Positive for back pain.  Skin: Negative.   Neurological:  Positive for dizziness (at times). Negative for headaches.  Psychiatric/Behavioral: Negative.        Objective:    BP 114/74 (BP Location: Left Arm, Patient Position: Sitting, Cuff Size: Large)   Pulse 79   Temp 97.9 F (36.6 C) (Temporal)   Ht '5\' 8"'$  (1.727 m)   Wt 173 lb 6.4 oz (78.7 kg)   LMP 05/25/2015 (Approximate)   SpO2 98%   BMI 26.37 kg/m   Wt Readings from Last 3 Encounters:  03/25/22 173 lb 6.4 oz (78.7 kg)  01/13/19 164 lb 6.4 oz (74.6 kg)  12/29/18 163 lb (73.9 kg)    BP Readings from Last 3 Encounters:  03/25/22 114/74  03/09/22 (!) 153/87  01/05/22 128/73    Physical Exam     Assessment & Plan:   Problem List Items Addressed This Visit   None    Follow up plan: No follow-ups on file.

## 2022-03-26 ENCOUNTER — Telehealth: Payer: Self-pay | Admitting: Nurse Practitioner

## 2022-03-26 DIAGNOSIS — R229 Localized swelling, mass and lump, unspecified: Secondary | ICD-10-CM

## 2022-03-26 DIAGNOSIS — R002 Palpitations: Secondary | ICD-10-CM | POA: Insufficient documentation

## 2022-03-26 DIAGNOSIS — G8929 Other chronic pain: Secondary | ICD-10-CM | POA: Insufficient documentation

## 2022-03-26 LAB — HEMOGLOBIN A1C: Hgb A1c MFr Bld: 5.8 % (ref 4.6–6.5)

## 2022-03-26 LAB — LIPID PANEL
Cholesterol: 193 mg/dL (ref 0–200)
HDL: 84.9 mg/dL (ref >39.00)
LDL Cholesterol: 97 mg/dL (ref 0–99)
NonHDL: 108.46
Total CHOL/HDL Ratio: 2
Triglycerides: 59 mg/dL (ref 0.0–149.0)
VLDL: 11.8 mg/dL (ref 0.0–40.0)

## 2022-03-26 LAB — VITAMIN B12: Vitamin B-12: 771 pg/mL (ref 211–911)

## 2022-03-26 LAB — VITAMIN D 25 HYDROXY (VIT D DEFICIENCY, FRACTURES): VITD: 35.59 ng/mL (ref 30.00–100.00)

## 2022-03-26 LAB — TSH: TSH: 0.47 u[IU]/mL (ref 0.35–5.50)

## 2022-03-26 NOTE — Telephone Encounter (Signed)
Informed ultrasound ordered by Ander Purpura, to expect a call to schedule that. Pt verbalized understanding.

## 2022-03-26 NOTE — Telephone Encounter (Signed)
Please advise- I reviewed chart, I see she is scheduled for MRI thoracic spine w/o and also reviewed your OV note yesterday, I didn't see anything about a lump?

## 2022-03-26 NOTE — Assessment & Plan Note (Addendum)
She has been experiencing palpitations for the past 2 months.  Discussed decreasing the amount of tea/caffeine that she is drinking.  Encouraged her to drink plenty of fluids.  EKG, CMP, CBC reviewed from urgent care and showed normal sinus rhythm.  Will place referral to cardiology with ongoing symptoms.  Will check TSH today

## 2022-03-26 NOTE — Telephone Encounter (Signed)
Caller Name: Idalia Allbritton Call back phone #: [phone]  Reason for Call: Pt was looking over MRI ordered for tomorrow (12/08) and wants to make sure it does cover the lump on her back. Please call pt

## 2022-03-26 NOTE — Assessment & Plan Note (Signed)
She has been experiencing left-sided thoracic back pain that radiates around her ribs for the past 2 months.  She went to a chiropractor and states that her symptoms are improving.  She had an x-ray done at the chiropractor which did not show any cause for the pain.  Will place an order for MRI for further evaluation.

## 2022-03-27 ENCOUNTER — Other Ambulatory Visit: Payer: Self-pay | Admitting: Nurse Practitioner

## 2022-03-27 ENCOUNTER — Ambulatory Visit (HOSPITAL_BASED_OUTPATIENT_CLINIC_OR_DEPARTMENT_OTHER)
Admission: RE | Admit: 2022-03-27 | Discharge: 2022-03-27 | Disposition: A | Discharge status: Home / Self Care | Payer: No Typology Code available for payment source | Source: Ambulatory Visit | Attending: Nurse Practitioner | Admitting: Nurse Practitioner

## 2022-03-27 ENCOUNTER — Ambulatory Visit (HOSPITAL_COMMUNITY): Payer: No Typology Code available for payment source

## 2022-03-27 DIAGNOSIS — R229 Localized swelling, mass and lump, unspecified: Secondary | ICD-10-CM | POA: Insufficient documentation

## 2022-03-27 DIAGNOSIS — G8929 Other chronic pain: Secondary | ICD-10-CM

## 2022-03-30 ENCOUNTER — Encounter: Payer: Self-pay | Admitting: Nurse Practitioner

## 2022-03-30 ENCOUNTER — Ambulatory Visit
Admission: RE | Admit: 2022-03-30 | Discharge: 2022-03-30 | Disposition: A | Discharge status: Home / Self Care | Payer: No Typology Code available for payment source | Source: Ambulatory Visit | Attending: Nurse Practitioner | Admitting: Nurse Practitioner

## 2022-03-30 DIAGNOSIS — G8929 Other chronic pain: Secondary | ICD-10-CM

## 2022-04-03 ENCOUNTER — Encounter: Payer: Self-pay | Admitting: Nurse Practitioner

## 2022-04-03 ENCOUNTER — Ambulatory Visit
Payer: No Typology Code available for payment source | Attending: Cardiovascular Disease | Admitting: Cardiovascular Disease

## 2022-04-03 ENCOUNTER — Ambulatory Visit (INDEPENDENT_AMBULATORY_CARE_PROVIDER_SITE_OTHER): Payer: No Typology Code available for payment source

## 2022-04-03 ENCOUNTER — Encounter: Payer: Self-pay | Admitting: Cardiovascular Disease

## 2022-04-03 VITALS — BP 120/82 | HR 72 | Ht 68.75 in | Wt 175.0 lb

## 2022-04-03 DIAGNOSIS — M546 Pain in thoracic spine: Secondary | ICD-10-CM | POA: Diagnosis not present

## 2022-04-03 DIAGNOSIS — R002 Palpitations: Secondary | ICD-10-CM | POA: Diagnosis not present

## 2022-04-03 DIAGNOSIS — G8929 Other chronic pain: Secondary | ICD-10-CM

## 2022-04-03 NOTE — Progress Notes (Unsigned)
Enrolled patient for a 3 day Zio XT monitor to be mailed to patients home  

## 2022-04-03 NOTE — Patient Instructions (Signed)
Medication Instructions:  Your physician recommends that you continue on your current medications as directed. Please refer to the Current Medication list given to you today.  *If you need a refill on your cardiac medications before your next appointment, please call your pharmacy*   Lab Work: NONE If you have labs (blood work) drawn today and your tests are completely normal, you will receive your results only by: [facility] (if you have MyChart) OR A paper copy in the mail If you have any lab test that is abnormal or we need to change your treatment, we will call you to review the results.   Testing/Procedures: [facility] [place]- Long Term Monitor Instructions  Your physician has requested you wear a ZIO patch monitor for 3 days.  This is a single patch monitor. [employer] supplies one patch monitor per enrollment. Additional stickers are not available. Please do not apply patch if you will be having a Nuclear Stress Test,  Echocardiogram, Cardiac CT, MRI, or Chest Xray during the period you would be wearing the  monitor. The patch cannot be worn during these tests. You cannot remove and re-apply the  ZIO XT patch monitor.  Your ZIO patch monitor will be mailed 3 day USPS to your address on file. It may take 3-5 days  to receive your monitor after you have been enrolled.  Once you have received your monitor, please review the enclosed instructions. Your monitor  has already been registered assigning a specific monitor serial # to you.  Billing and Patient Assistance Program Information  We have supplied [employer] with any of your insurance information on file for billing purposes. [employer] offers a sliding scale Patient Assistance Program for patients that do not have  insurance, or whose insurance does not completely cover the cost of the ZIO monitor.  You must apply for the Patient Assistance Program to qualify for this discounted rate.  To apply, please call [employer] at [phone], select  option 4, select option 2, ask to apply for  Patient Assistance Program. Theodore Demark will ask your household income, and how many people  are in your household. They will quote your out-of-pocket cost based on that information.  [employer] will also be able to set up a 61-month interest-free payment plan if needed.  Applying the monitor   Shave hair from upper left chest.  Hold abrader disc by orange tab. Rub abrader in 40 strokes over the upper left chest as  indicated in your monitor instructions.  Clean area with 4 enclosed alcohol pads. Let dry.  Apply patch as indicated in monitor instructions. Patch will be placed under collarbone on left  side of chest with arrow pointing upward.  Rub patch adhesive wings for 2 minutes. Remove white label marked "1". Remove the white  label marked "2". Rub patch adhesive wings for 2 additional minutes.  While looking in a mirror, press and release button in center of patch. A small green light will  flash 3-4 times. This will be your only indicator that the monitor has been turned on.  Do not shower for the first 24 hours. You may shower after the first 24 hours.  Press the button if you feel a symptom. You will hear a small click. Record Date, Time and  Symptom in the Patient Logbook.  When you are ready to remove the patch, follow instructions on the last 2 pages of Patient  Logbook. Stick patch monitor onto the last page of Patient Logbook.  Place Patient Logbook in  the blue and white box. Use locking tab on box and tape box closed  securely. The blue and white box has prepaid postage on it. Please place it in the mailbox as  soon as possible. Your physician should have your test results approximately 7 days after the  monitor has been mailed back to [facility].  Call [facility] at [phone] if you have questions regarding  your ZIO XT patch monitor. Call them immediately if you see an orange light blinking on your  monitor.   If your monitor falls off in less than 4 days, contact our Monitor department at [phone].  If your monitor becomes loose or falls off after 4 days call [employer] at [phone] for  suggestions on securing your monitor    Follow-Up: At [facility], you and your health needs are our priority.  As part of our continuing mission to provide you with exceptional heart care, we have created designated Provider Care Teams.  These Care Teams include your primary Cardiologist (physician) and Advanced Practice Providers (APPs -  Physician Assistants and Nurse Practitioners) who all work together to provide you with the care you need, when you need it.  We recommend signing up for the patient portal called "MyChart".  Sign up information is provided on this After Visit Summary.  MyChart is used to connect with patients for Virtual Visits (Telemedicine).  Patients are able to view lab/test results, encounter notes, upcoming appointments, etc.  Non-urgent messages can be sent to your provider as well.   To learn more about what you can do with MyChart, go to [email or web address].    Your next appointment:   As Needed   The format for your next appointment:   In Person  Provider:   Sanda Klein, MD     Other Instructions [email or web address]        FDA-cleared, clinical grade mobile EKG monitor: Jodelle Red is the most clinically-validated mobile EKG used by the world's leading cardiac care medical professionals With Basic service, know instantly if your heart rhythm is normal or if atrial fibrillation is detected, and email the last single EKG recording to yourself or your doctor Premium service, available for purchase through the [employer] app for $9.99 per month or $99 per year, includes unlimited history and storage of your EKG recordings, a monthly EKG summary report to share with your doctor, along with the ability to track your blood  pressure, activity and weight Includes one K[employer]iaMobile phone clip FREE SHIPPING: Standard delivery 1-3 business days. Orders placed by 11:00am PST will ship that afternoon. Otherwise, will ship next business day. All orders ship via [employer] from [place], [place] - sending an EKG Download app and set up profile. Run EKG - by placing 1-2 fingers on the silver plates After EKG is complete - Download PDF  - Skip password (if you apply a password the provider will need it to view the EKG) Click share button (square with upward arrow) in bottom left corner To send: choose MyChart (first time log into MyChart)  Pop up window about sending ECG Click continue Choose type of message Choose provider Type subject and message Click send (EKG should be attached)  - To send additional EKGs in one message click the paperclip image and bottom of page to attach.     Important Information About Sugar

## 2022-04-03 NOTE — Progress Notes (Unsigned)
Cardiology Office Note:    Date:  04/05/2022   ID:  Deanna [name], DOB Aug 13, 1970, MRN [id number]  PCP:  Charyl Dancer, NP   [facility] Cardiologist:  Sanda Klein, MD     Referring MD: Charyl Dancer, NP   Chief Complaint  Patient presents with   Consult  Deanna [name] is a 51 y.o. female who is being seen today for the evaluation of chest pain and palpitations at the request of McElwee, Lauren A, NP.   History of Present Illness:    Deanna [name] is a 51 y.o. 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  Left-sided thoracic back pain is a chronic complaint that has been going on for several years (at least since 2019) and has been worse for at least the last 3 months.  The discomfort can last for days on end.  It is not associated with physical activity or meals.  It does not associate shortness of breath or palpitations.  It radiates down her left arm but largely has a piercing quality from her back to her precordial area.  It sounds musculoskeletal and has some positional qualities.  ECGs performed during chest pain have been normal.  She had a spine MRI recently.  The thoracic aorta is seen reasonably well on the images, although the radiologist did not comment on it.  There is no evidence of aortic aneurysm or dissection.  The palpitations appear to have a gradual onset and gradual resolution.  This sometimes awakes her from sleep at night.  There is no obvious precipitant for them.  She has not had recent syncope.  In 2015 she had an echocardiogram performed for "syncope" (she points out that she never completely lost consciousness, simply felt lightheaded and had to lay down; she was evaluated at that time by Dr. Jannifer Franklin in the neurology clinic).  The echo was a normal test. An event monitor performed at that time  was also normal.  MRI of the brain was also normal.  Dr. Jannifer Franklin suggested that the symptoms may have neurocardiogenic mechanism  Past Medical History:  Diagnosis Date   Allergy    BV (bacterial vaginosis)    GERD (gastroesophageal reflux disease)    HSV-1 infection    IBS (irritable bowel syndrome)    Lipoma    Near syncope 02/21/2014   Ovarian cyst    Syncope 02/21/2014   Yeast infection     Past Surgical History:  Procedure Laterality Date   BREAST BIOPSY  2008   LAPAROSCOPY  1991   no endometriosis found   SUPRACERVICAL ABDOMINAL HYSTERECTOMY N/A 07/09/2015   Procedure: HYSTERECTOMY SUPRACERVICAL ABDOMINAL with Bilateral Salpingectomy;  Surgeon: Eldred Manges, MD;  Location: [facility];  Service: Gynecology;  Laterality: N/A;    Current Medications: Current Meds  Medication Sig   cetirizine (ZYRTEC) 10 MG tablet Take 10 mg by mouth daily.   Multiple Vitamins-Minerals (WOMENS 50+ MULTI VITAMIN/MIN) TABS Take by mouth.   UNABLE TO FIND Med Name: OTC Vitamin D3   UNABLE TO FIND Med Name: Vitamin B12     Allergies:   Patient has no known allergies.   Social History   Socioeconomic History   Marital status: Single    Spouse name: Not on file   Number of children: 1   Years of education: BS  Highest education level: Not on file  Occupational History   Occupation: Pharmacologist  Tobacco Use   Smoking status: Never    Passive exposure: Never   Smokeless tobacco: Never  Vaping Use   Vaping Use: Never used  Substance and Sexual Activity   Alcohol use: Yes    Alcohol/week: 0.0 standard drinks of alcohol    Comment: wine occassional   Drug use: No   Sexual activity: Yes    Birth control/protection: Condom    Comment: orsythia   Other Topics Concern   Not on file  Social History Narrative   Patient drinks coffee and tea 3x a week.   Social Determinants of Health   Financial Resource Strain: Not on file  Food Insecurity: Not on file  Transportation Needs:  Not on file  Physical Activity: Not on file  Stress: Not on file  Social Connections: Not on file     Family History: The patient's family history includes Anemia in her mother; Cancer in her paternal grandfather and paternal uncle; Heart disease in her maternal grandmother; Hypertension in her maternal grandmother; Sickle cell trait in her cousin; Throat cancer in her paternal uncle.  ROS:   Please see the history of present illness.     All other systems reviewed and are negative.  EKGs/Labs/Other Studies Reviewed:    The following studies were reviewed today: MRI thoracic spine 03/30/2022 Echo 2015 - Left ventricle: The cavity size was normal. Wall thickness was    normal. Systolic function was normal. The estimated ejection    fraction was in the range of 55% to 60%. Wall motion was normal;    there were no regional wall motion abnormalities. Left    ventricular diastolic function parameters were normal.  - Left atrium: The atrium was normal in size.   Impressions:   - Normal study.  Event monitor 2015  EKG:  EKG is not ordered today.  The ekg ordered 03/09/2022 demonstrates sinus rhythm, normal tracing  Recent Labs: 03/09/2022: ALT 12; BUN 7; Creatinine, Ser 0.75; Hemoglobin 14.6; Platelets 223; Potassium 4.1; Sodium 141 03/25/2022: TSH 0.47  Recent Lipid Panel    Component Value Date/Time   CHOL 193 03/25/2022 1559   CHOL 155 06/21/2017 1639   TRIG 59.0 03/25/2022 1559   HDL 84.90 03/25/2022 1559   HDL 70 06/21/2017 1639   CHOLHDL 2 03/25/2022 1559   VLDL 11.8 03/25/2022 1559   LDLCALC 97 03/25/2022 1559   LDLCALC 77 06/21/2017 1639     Risk Assessment/Calculations:      Physical Exam:    VS:  BP 120/82 (BP Location: Left Arm, Patient Position: Sitting, Cuff Size: Normal)   Pulse 72   Ht 5' 8.75" (1.746 m)   Wt 175 lb (79.4 kg)   LMP 05/25/2015 (Approximate)   SpO2 98%   BMI 26.03 kg/m     Wt Readings from Last 3 Encounters:  04/03/22 175 lb (79.4  kg)  03/25/22 173 lb 6.4 oz (78.7 kg)  01/13/19 164 lb 6.4 oz (74.6 kg)     GEN: Appears fit, well nourished, well developed in no acute distress HEENT: Normal NECK: No JVD; No carotid bruits LYMPHATICS: No lymphadenopathy CARDIAC: RRR, no murmurs, rubs, gallops RESPIRATORY:  Clear to auscultation without rales, wheezing or rhonchi  ABDOMEN: Soft, non-tender, non-distended MUSCULOSKELETAL:  No edema; No deformity  SKIN: Warm and dry NEUROLOGIC:  Alert and oriented x 3 PSYCHIATRIC:  Normal affect   ASSESSMENT:    1. Palpitations  2. Chronic left-sided thoracic back pain    PLAN:    In order of problems listed above:  Palpitations: The episodes are rather unpredictable.  Will get her to wear another event monitor, but also advised her to get a commercially available electronic device such as a smart watch or Kardia monitor since we may not catch any events on the medical device.  Otherwise does not have worsening cardiac symptoms, has not had any near syncope in several years and has a normal cardiovascular examination.  Had a normal echo in 2015.  The likelihood of malignant arrhythmia is low. Chest pain: Symptoms sound decidedly musculoskeletal.  Not suggestive of coronary insufficiency.  The radiation from back to anterior chest was concerning for aortic disease, but aorta is seen pretty well on an MRI of her thoracic spine and appears to be normal.  At this point evaluation for coronary disease does not appear to be justified.           Medication Adjustments/Labs and Tests Ordered: Current medicines are reviewed at length with the patient today.  Concerns regarding medicines are outlined above.  Orders Placed This Encounter  Procedures   LONG TERM MONITOR (3-14 DAYS)   No orders of the defined types were placed in this encounter.   Patient Instructions  Medication Instructions:  Your physician recommends that you continue on your current medications as directed.  Please refer to the Current Medication list given to you today.  *If you need a refill on your cardiac medications before your next appointment, please call your pharmacy*   Lab Work: NONE If you have labs (blood work) drawn today and your tests are completely normal, you will receive your results only by: [place] (if you have MyChart) OR A paper copy in the mail If you have any lab test that is abnormal or we need to change your treatment, we will call you to review the results.   Testing/Procedures: [place] [place]- Long Term Monitor Instructions  Your physician has requested you wear a ZIO patch monitor for 3 days.  This is a single patch monitor. [employer] supplies one patch monitor per enrollment. Additional stickers are not available. Please do not apply patch if you will be having a Nuclear Stress Test,  Echocardiogram, Cardiac CT, MRI, or Chest Xray during the period you would be wearing the  monitor. The patch cannot be worn during these tests. You cannot remove and re-apply the  ZIO XT patch monitor.  Your ZIO patch monitor will be mailed 3 day USPS to your address on file. It may take 3-5 days  to receive your monitor after you have been enrolled.  Once you have received your monitor, please review the enclosed instructions. Your monitor  has already been registered assigning a specific monitor serial # to you.  Billing and Patient Assistance Program Information  We have supplied [employer] with any of your insurance information on file for billing purposes. [employer] offers a sliding scale Patient Assistance Program for patients that do not have  insurance, or whose insurance does not completely cover the cost of the ZIO monitor.  You must apply for the Patient Assistance Program to qualify for this discounted rate.  To apply, please call [employer] at [phone], select option 4, select option 2, ask to apply for  Patient Assistance Program. Theodore Demark will ask your household  income, and how many people  are in your household. They will quote your out-of-pocket cost based on that information.  [employer]  will also be able to set up a 17-month interest-free payment plan if needed.  Applying the monitor   Shave hair from upper left chest.  Hold abrader disc by orange tab. Rub abrader in 40 strokes over the upper left chest as  indicated in your monitor instructions.  Clean area with 4 enclosed alcohol pads. Let dry.  Apply patch as indicated in monitor instructions. Patch will be placed under collarbone on left  side of chest with arrow pointing upward.  Rub patch adhesive wings for 2 minutes. Remove white label marked "1". Remove the white  label marked "2". Rub patch adhesive wings for 2 additional minutes.  While looking in a mirror, press and release button in center of patch. A small green light will  flash 3-4 times. This will be your only indicator that the monitor has been turned on.  Do not shower for the first 24 hours. You may shower after the first 24 hours.  Press the button if you feel a symptom. You will hear a small click. Record Date, Time and  Symptom in the Patient Logbook.  When you are ready to remove the patch, follow instructions on the last 2 pages of Patient  Logbook. Stick patch monitor onto the last page of Patient Logbook.  Place Patient Logbook in the blue and white box. Use locking tab on box and tape box closed  securely. The blue and white box has prepaid postage on it. Please place it in the mailbox as  soon as possible. Your physician should have your test results approximately 7 days after the  monitor has been mailed [facility] to [facility]  Call [place]at [phone] you have questions regarding  your ZIO XT patch monitor. Call them immediately if you see an orange light blinking on your  monitor.  If your monitor falls off in less than 4 days, contact our Monitor department at [phone]  If your  monitor becomes loose or falls off after 4 days call [employer] at [phone]  suggestions on securing your monitor    Follow-Up: [facility] you and your health needs are our priority.  As part of our continuing mission to provide you with exceptional heart care, we have created designated Provider Care Teams.  These Care Teams include your primary Cardiologist (physician) and Advanced Practice Providers (APPs -  Physician Assistants and Nurse Practitioners) who all work together to provide you with the care you need, when you need it.  We recommend signing up for the patient portal called "MyChart".  Sign up information is provided on this After Visit Summary.  MyChart is used to connect with patients for Virtual Visits (Telemedicine).  Patients are able to view lab/test results, encounter notes, upcoming appointments, etc.  Non-urgent messages can be sent to your provider as well.   To learn more about what you can do with MyChart, go to [email or web address]    Your next appointment:   As Needed   The format for your next appointment:   In Person  Provider:   MSanda Klein MD     Other Instructions [email or web address] [email or web address]        FDA-cleared, clinical grade mobile EKG monitor: KJodelle Redis the most clinically-validated mobile EKG used by the world's leading cardiac care medical professionals With Basic service, know instantly if your heart rhythm is normal or if atrial fibrillation is detected, and email the last single EKG recording to yourself or your doctor Premium service,  available for purchase through the [facility] app for $9.99 per month or $99 per year, includes unlimited history and storage of your EKG recordings, a monthly EKG summary report to share with your doctor, along with the ability to track your blood pressure, activity and weight Includes one KardiaMobile phone clip FREE SHIPPING: Standard delivery 1-3 business  days. Orders placed by 11:00am PST will ship that afternoon. Otherwise, will ship next business day. All orders ship via [employer] from [place], [place] - sending an EKG Download app and set up profile. Run EKG - by placing 1-2 fingers on the silver plates After EKG is complete - Download PDF  - Skip password (if you apply a password the provider will need it to view the EKG) Click share button (square with upward arrow) in bottom left corner To send: choose MyChart (first time log into MyChart)  Pop up window about sending ECG Click continue Choose type of message Choose provider Type subject and message Click send (EKG should be attached)  - To send additional EKGs in one message click the paperclip image and bottom of page to attach.     Important Information About Sugar         Signed, Sanda Klein, MD  04/05/2022 4:26 PM    [place]

## 2022-04-05 ENCOUNTER — Encounter: Payer: Self-pay | Admitting: Cardiovascular Disease

## 2022-04-08 DIAGNOSIS — R002 Palpitations: Secondary | ICD-10-CM

## 2022-06-09 ENCOUNTER — Ambulatory Visit: Payer: No Typology Code available for payment source | Admitting: Obstetrics and Gynecology

## 2022-06-09 ENCOUNTER — Other Ambulatory Visit (HOSPITAL_COMMUNITY)
Admission: RE | Admit: 2022-06-09 | Discharge: 2022-06-09 | Disposition: A | Discharge status: Home / Self Care | Payer: No Typology Code available for payment source | Source: Ambulatory Visit | Attending: Obstetrics and Gynecology | Admitting: Obstetrics and Gynecology

## 2022-06-09 ENCOUNTER — Encounter: Payer: Self-pay | Admitting: Obstetrics and Gynecology

## 2022-06-09 VITALS — BP 128/87 | HR 69 | Ht 68.75 in | Wt 179.0 lb

## 2022-06-09 DIAGNOSIS — Z124 Encounter for screening for malignant neoplasm of cervix: Secondary | ICD-10-CM | POA: Diagnosis not present

## 2022-06-09 DIAGNOSIS — N951 Menopausal and female climacteric states: Secondary | ICD-10-CM

## 2022-06-09 DIAGNOSIS — Z01419 Encounter for gynecological examination (general) (routine) without abnormal findings: Secondary | ICD-10-CM | POA: Diagnosis not present

## 2022-06-09 DIAGNOSIS — R232 Flushing: Secondary | ICD-10-CM

## 2022-06-09 NOTE — Patient Instructions (Signed)
Women's Health Initiative = name of the big study about hormone therapy

## 2022-06-09 NOTE — Progress Notes (Signed)
ANNUAL EXAM Patient name: Deanna [name] MRN [id number]  Date of birth: [birth year] Chief Complaint:   Gynecologic Exam  History of Present Illness:   Deanna [name] is a 52 y.o. [id number] s/p supracervical hysterectomy/BS (2017) being seen today for a routine annual exam.  Current complaints:   Hot flashes - occurring since covid (around 2020). Bothersome. Had testing at [facility] c/w menopause  Last pap 2015 NILM/HPV neg Last mammogram: 01/2021 per patient. Has MMG scheduled 06/2022 through [place] Last colonoscopy: N/A. Does annual FOBT through [place], normal within last 6 months per pt     03/25/2022    4:09 PM 04/18/2019   11:29 AM 01/13/2019    1:22 PM 12/29/2018   10:45 AM 05/25/2018   10:23 AM  Depression screen PHQ 2/9  Decreased Interest 0 0 0 0 0  Down, Depressed, Hopeless 0 0 0 0 0  PHQ - 2 Score 0 0 0 0 0  Altered sleeping 0      Tired, decreased energy 2      Change in appetite 2      Feeling bad or failure about yourself  0      Trouble concentrating 0      Moving slowly or fidgety/restless 0      Suicidal thoughts 0      PHQ-9 Score 4      Difficult doing work/chores Not difficult at all          03/25/2022    4:09 PM  GAD 7 : Generalized Anxiety Score  Nervous, Anxious, on Edge 1  Control/stop worrying 0  Worry too much - different things 0  Trouble relaxing 0  Restless 0  Easily annoyed or irritable 1  Afraid - awful might happen 0  Total GAD 7 Score 2  Anxiety Difficulty Not difficult at all   Review of Systems:   Pertinent items are noted in HPI Denies any headaches, blurred vision, fatigue, shortness of breath, chest pain, abdominal pain, abnormal vaginal discharge/itching/odor/irritation, problems with periods, bowel movements, urination, or intercourse unless otherwise stated above. Pertinent History Reviewed:  Reviewed past medical,surgical, social and family history.  Reviewed problem list, medications and allergies. Physical Assessment:   Vitals:    06/09/22 1518  BP: 128/87  Pulse: 69  Weight: 179 lb (81.2 kg)  Height: 5' 8.75" (1.746 m)  Body mass index is 26.63 kg/m.        Physical Examination:   General appearance - well appearing, and in no distress  Mental status - alert, oriented to person, place, and time  Chest - respiratory effort normal  Heart - normal peripheral perfusion  Breasts - deferred  Abdomen - soft, nontender, nondistended, no masses or organomegaly  Pelvic - VULVA: normal appearing vulva with no masses, tenderness or lesions  VAGINA: normal appearing vagina with normal color and discharge, no lesions. Mild atrophic changes   CERVIX: normal appearing cervix without discharge or lesions, no CMT  Thin prep pap is done with HR HPV cotesting  ADNEXA: No adnexal masses or tenderness noted.  Chaperone present for exam  No results found for this or any previous visit (from the past 24 hour(s)).  Assessment & Plan:  1) Well-Woman Exam Mammogram:  scheduled 06/2022 Colonoscopy: FOBT through [facility] up to date [id number]  2) Hot flashes due to menopause Discussed r/b of hormone therapy and women's health initiative. Discussed SNRI as alternative. Pt considering options  Labs/procedures today:  -     Cytology -  PAP  Meds: None  Follow-up: Return for as needed.  Inez Catalina, MD 06/09/2022 4:54 PM

## 2022-06-09 NOTE — Progress Notes (Signed)
GYN/ Annual Pap ?2022 with [place] doctor 07/09/15 Abdominal hysterectomy/ supracervical, bilateral salp. Reports constant hot flashes. Depression and anxiety screen negative 2 mos ago and declines today/ reports no changes

## 2022-06-12 LAB — CYTOLOGY - PAP
Comment: NEGATIVE
Diagnosis: NEGATIVE
High risk HPV: NEGATIVE

## 2022-06-24 ENCOUNTER — Encounter: Payer: No Typology Code available for payment source | Admitting: Nurse Practitioner

## 2022-07-17 ENCOUNTER — Encounter: Payer: Self-pay | Admitting: Nurse Practitioner

## 2022-08-21 ENCOUNTER — Encounter (HOSPITAL_BASED_OUTPATIENT_CLINIC_OR_DEPARTMENT_OTHER): Payer: No Typology Code available for payment source | Attending: Internal Medicine | Admitting: Internal Medicine

## 2022-08-21 DIAGNOSIS — D239 Other benign neoplasm of skin, unspecified: Secondary | ICD-10-CM

## 2022-08-21 DIAGNOSIS — T8131XA Disruption of external operation (surgical) wound, not elsewhere classified, initial encounter: Secondary | ICD-10-CM | POA: Diagnosis not present

## 2022-08-21 DIAGNOSIS — S91302A Unspecified open wound, left foot, initial encounter: Secondary | ICD-10-CM

## 2022-08-21 DIAGNOSIS — X58XXXA Exposure to other specified factors, initial encounter: Secondary | ICD-10-CM | POA: Insufficient documentation

## 2022-08-22 NOTE — Progress Notes (Signed)
Deanna [name], Deanna [name] ([id number]) [id number] Page 1 of 8 Visit Report for 08/21/2022 Chief Complaint Document Details Patient Name: Date of Service: [place] CHEZ, GRAUER 08/21/2022 8:00 A M Medical Record Number: [id number] Patient Account Number: [id number] Date of Birth/Sex: Treating RN: Aug 21, 1970 (52 y.o. F) Primary Care Provider: Rodman Pickle Other Clinician: Referring Provider: Treating Provider/Extender: Claris Gladden, Deanna [name] Weeks in Treatment: 0 Information Obtained from: Patient Chief Complaint 08/21/2022; left heel wound Electronic Signature(s) Signed: 08/21/2022 10:08:01 AM By: Geralyn Corwin DO Entered By: Geralyn Corwin on 08/21/2022 09:02:48 -------------------------------------------------------------------------------- Debridement Details Patient Name: Date of Service: [name] 08/21/2022 8:00 A M Medical Record Number: [id number] Patient Account Number: [id number] Date of Birth/Sex: Treating RN: [birth year] (52 y.o. Deanna [name], Deanna [name] Primary Care Provider: Rodman Pickle Other Clinician: Referring Provider: Treating Provider/Extender: York Grice in Treatment: 0 Debridement Performed for Assessment: Wound #1 Left Calcaneus Performed By: Physician Geralyn Corwin, DO Debridement Type: Debridement Level of Consciousness (Pre-procedure): Awake and Alert Pre-procedure Verification/Time Out Yes - 08:53 Taken: Start Time: 08:53 Pain Control: Lidocaine Percent of Wound Bed Debrided: 100% T Area Debrided (cm): otal 2.83 Tissue and other material debrided: Viable, Non-Viable, Slough, Subcutaneous, Slough Level: Skin/Subcutaneous Tissue Debridement Description: Excisional Instrument: Curette Bleeding: Minimum Hemostasis Achieved: Pressure End Time: 08:53 Procedural Pain: 0 Post Procedural Pain: 0 Response to Treatment: Procedure was tolerated well Level of Consciousness (Post- Awake and  Alert procedure): Post Debridement Measurements of Total Wound Length: (cm) 1.5 Width: (cm) 2.4 Depth: (cm) 0.3 Volume: (cm) 0.848 Character of Wound/Ulcer Post Debridement: Improved Post Procedure Diagnosis Same as Pre-procedure Electronic Signature(s) Signed: 08/21/2022 9:13:40 AM By: Fonnie Mu RN Signed: 08/21/2022 10:08:01 AM By: Geralyn Corwin DO Entered By: Fonnie Mu on 08/21/2022 08:54:27 Annette Stable ([id number]) [id number] Page 2 of 8 -------------------------------------------------------------------------------- HPI Details Patient Name: Date of Service: [place] TAWNIA, ERBER 08/21/2022 8:00 A M Medical Record Number: [id number] Patient Account Number: [id number] Date of Birth/Sex: Treating RN: 02/25/1971 (52 y.o. F) Primary Care Provider: Rodman Pickle Other Clinician: Referring Provider: Treating Provider/Extender: York Grice in Treatment: 0 History of Present Illness HPI Description: 08/21/2022 Ms. Deanna [name] is a 52 year old female with history of uterine fibroids that presents to the clinic for a 3-4-week history of nonhealing wound to the left heel. She had a lesion that was biopsied to this site by her dermatologist. Read was squamous cell carcinoma versus porocarcinoma. She subsequently had excision to remove the lesion On April 9th. She was subsequently treated with Unna boots and a foam dressing. 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 Currently patient denies signs of infection. Electronic Signature(s) Signed: 08/21/2022 10:08:01 AM By: Geralyn Corwin DO Entered By: Geralyn Corwin on 08/21/2022 09:10:39 -------------------------------------------------------------------------------- Physical Exam Details Patient Name: Date of Service: Deanna [name] 08/21/2022 8:00 A M Medical Record Number:  [id number] Patient Account Number: [id number] Date of Birth/Sex: Treating RN: 11-30-1970 (52 y.o. F) Primary Care Provider: Rodman Pickle Other Clinician: Referring Provider: Treating Provider/Extender: Claris Gladden, Deanna [name] Weeks in Treatment: 0 Constitutional respirations regular, non-labored and within target range for patient.. Cardiovascular 2+ dorsalis pedis/posterior tibialis pulses. Psychiatric pleasant and cooperative. Notes Left heel: Open wound to the calcaneus on the posterior aspect with granulation tissue and nonviable tissue. No signs of surrounding infection including increased warmth, erythema or purulent drainage. Electronic Signature(s) Signed: 08/21/2022 10:08:01 AM By: Geralyn Corwin DO Entered By: Geralyn Corwin on 08/21/2022  09:11:05 -------------------------------------------------------------------------------- Physician Orders Details Patient Name: Date of Service: [place] [name], Deanna 08/21/2022 8:00 A M Medical Record Number: [id number] Patient Account Number: [id number] Date of Birth/Sex: Treating RN: 27-May-1970 (52 y.o. Deanna [name], Deanna [name] Primary Care Provider: Rodman Pickle Other Clinician: Referring Provider: Treating Provider/Extender: York Grice in Treatment: 0 Verbal / Phone Orders: No Diagnosis Coding ICD-10 Coding Code Description [phone] Unspecified open wound, left foot, initial encounter T81.31XA Disruption of external operation (surgical) wound, not elsewhere classified, initial encounter JAISLEY, SYNNOTT ([id number]) [id number] Page 3 of 8 D23.9 Other benign neoplasm of skin, unspecified Follow-up Appointments ppointment in 1 week. - w/ Dr. Mikey Bussing and [place] Rm # 8 Tuesday 08/25/22 @ 3:45 Return A Anesthetic (In clinic) Topical Lidocaine 5% applied to wound bed Bathing/ Shower/ Hygiene May shower with protection but do not get wound dressing(s) wet. Protect  dressing(s) with water repellant cover (for example, large plastic bag) or a cast cover and may then take shower. Edema Control - Lymphedema / SCD / Other Elevate legs to the level of the heart or above for 30 minutes daily and/or when sitting for 3-4 times a day throughout the day. Avoid standing for long periods of time. Off-Loading Other: - float heels when sitting/laying. Wound Treatment Wound #1 - Calcaneus Wound Laterality: Left Cleanser: Wound Cleanser (DME) (Generic) 1 x Per Day/15 Days Discharge Instructions: Cleanse the wound with wound cleanser prior to applying a clean dressing using gauze sponges, not tissue or cotton balls. Prim Dressing: Hydrofera Blue Ready Transfer Foam, 2.5x2.5 (in/in) (DME) (Generic) 1 x Per Day/15 Days ary Discharge Instructions: Apply directly to wound bed as directed Prim Dressing: MediHoney Gel, tube 1.5 (oz) 1 x Per Day/15 Days ary Discharge Instructions: Apply to wound bed as instructed Secondary Dressing: Woven Gauze Sponge, Non-Sterile 4x4 in (DME) (Generic) 1 x Per Day/15 Days Discharge Instructions: Apply over primary dressing as directed. Secondary Dressing: Zetuvit Plus Silicone Border Dressing 5x5 (in/in) (DME) (Generic) 1 x Per Day/15 Days Discharge Instructions: Apply silicone border over primary dressing as directed. Patient Medications llergies: No Known Allergies A Notifications Medication Indication Start End PRN debridements/pain5/06/2022 lidocaine DOSE topical 5 % gel - gel topical once daily Electronic Signature(s) Signed: 08/21/2022 9:13:40 AM By: Fonnie Mu RN Signed: 08/21/2022 10:08:01 AM By: Geralyn Corwin DO Entered By: Fonnie Mu on 08/21/2022 09:12:07 -------------------------------------------------------------------------------- Problem List Details Patient Name: Date of Service: [name]. 08/21/2022 8:00 A M Medical Record Number: [id number] Patient Account Number: [id number] Date of Birth/Sex:  Treating RN: 28-Jun-1970 (52 y.o. F) Primary Care Provider: Rodman Pickle Other Clinician: Referring Provider: Treating Provider/Extender: York Grice in Treatment: 0 Active Problems ICD-10 Encounter Code Description Active Date MDM Diagnosis S91.302A Unspecified open wound, left foot, initial encounter 08/21/2022 No Yes T81.31XA Disruption of external operation (surgical) wound, not elsewhere classified, 08/21/2022 No Yes initial encounter GAEL, BEVELS ([id number]) [id number] Page 4 of 8 D23.9 Other benign neoplasm of skin, unspecified 08/21/2022 No Yes Inactive Problems Resolved Problems Electronic Signature(s) Signed: 08/21/2022 10:08:01 AM By: Geralyn Corwin DO Entered By: Geralyn Corwin on 08/21/2022 09:00:48 -------------------------------------------------------------------------------- Progress Note Details Patient Name: Date of Service: [name]. 08/21/2022 8:00 A M Medical Record Number: [id number] Patient Account Number: [id number] Date of Birth/Sex: Treating RN: 1970/10/05 (52 y.o. F) Primary Care Provider: Rodman Pickle Other Clinician: Referring Provider: Treating Provider/Extender: York Grice in Treatment: 0 Subjective Chief Complaint Information obtained from Patient 08/21/2022; left heel wound History of Present  Illness (HPI) 08/21/2022 Ms. Sada Varda is a 52 year old female with history of uterine fibroids that presents to the clinic for a 3-4-week history of nonhealing wound to the left heel. She had a lesion that was biopsied to this site by her dermatologist. Read was squamous cell carcinoma versus porocarcinoma. She subsequently had excision to remove the lesion On April 9th. She was subsequently treated with Unna boots and a foam dressing. 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 Currently  patient denies signs of infection. Patient History Information obtained from Patient, Chart. Allergies No Known Allergies Family History Unknown History. Social History Never smoker, Marital Status - Single, Alcohol Use - Never, Drug Use - No History, Caffeine Use - Rarely. Review of Systems (ROS) Constitutional Symptoms (General Health) Denies complaints or symptoms of Fatigue, Fever, Chills, Marked Weight Change. Eyes Denies complaints or symptoms of Dry Eyes, Vision Changes, Glasses / Contacts. Ear/Nose/Mouth/Throat Denies complaints or symptoms of Chronic sinus problems or rhinitis. Respiratory Denies complaints or symptoms of Chronic or frequent coughs, Shortness of Breath. Cardiovascular Denies complaints or symptoms of Chest pain. Gastrointestinal GERD; ibs Endocrine Denies complaints or symptoms of Heat/cold intolerance. Genitourinary ovarian cyst; yeast infection; bacterial vaginosis Integumentary (Skin) Complains or has symptoms of Wounds. Musculoskeletal Denies complaints or symptoms of Muscle Pain, Muscle Weakness. Neurologic Denies complaints or symptoms of Numbness/parasthesias. Psychiatric Denies complaints or symptoms of Claustrophobia. ZAYANNA, YECKLEY ([id number]) [id number] Page 5 of 8 Objective Constitutional respirations regular, non-labored and within target range for patient.. Vitals Time Taken: 8:14 AM, Temperature: 98.2 F, Pulse: 80 bpm, Respiratory Rate: 17 breaths/min, Blood Pressure: 144/89 mmHg. Cardiovascular 2+ dorsalis pedis/posterior tibialis pulses. Psychiatric pleasant and cooperative. General Notes: Left heel: Open wound to the calcaneus on the posterior aspect with granulation tissue and nonviable tissue. No signs of surrounding infection including increased warmth, erythema or purulent drainage. Integumentary (Hair, Skin) Wound #1 status is Open. Original cause of wound was Gradually Appeared. The date  acquired was: 07/28/2022. The wound is located on the Left Calcaneus. The wound measures 1.5cm length x 2.4cm width x 0.3cm depth; 2.827cm^2 area and 0.848cm^3 volume. There is Fat Layer (Subcutaneous Tissue) exposed. There is no tunneling or undermining noted. There is a medium amount of serosanguineous drainage noted. The wound margin is distinct with the outline attached to the wound base. There is medium (34-66%) red, pink granulation within the wound bed. There is a medium (34-66%) amount of necrotic tissue within the wound bed including Adherent Slough. The periwound skin appearance did not exhibit: Callus, Crepitus, Excoriation, Induration, Rash, Scarring, Dry/Scaly, Maceration, Atrophie Blanche, Cyanosis, Ecchymosis, Hemosiderin Staining, Mottled, Pallor, Rubor, Erythema. Periwound temperature was noted as No Abnormality. The periwound has tenderness on palpation. Assessment Active Problems ICD-10 Unspecified open wound, left foot, initial encounter Disruption of external operation (surgical) wound, not elsewhere classified, initial encounter Other benign neoplasm of skin, unspecified Patient presents with a 3-4-week history of nonhealing wound to the left heel that is post surgical excision of a benign lesion by dermatology. I debrided nonviable tissue. No signs of infection. I recommended Medihoney and Hydrofera Blue. We discussed aggressive offloading and patient is using backless shoes. Also recommended she float her heels at night and not let this at the bed. She expressed understanding. Follow-up in 1 week. She knows to call with any questions or concerns. Procedures Wound #1 Pre-procedure diagnosis of Wound #1 is an Open Surgical Wound located on the Left Calcaneus .  There was a Excisional Skin/Subcutaneous Tissue Debridement with a total area of 2.83 sq cm performed by Geralyn Corwin, DO. With the following instrument(s): Curette to remove Viable and Non-Viable tissue/material.  Material removed includes Subcutaneous Tissue and Slough and after achieving pain control using Lidocaine. No specimens were taken. A time out was conducted at 08:53, prior to the start of the procedure. A Minimum amount of bleeding was controlled with Pressure. The procedure was tolerated well with a pain level of 0 throughout and a pain level of 0 following the procedure. Post Debridement Measurements: 1.5cm length x 2.4cm width x 0.3cm depth; 0.848cm^3 volume. Character of Wound/Ulcer Post Debridement is improved. Post procedure Diagnosis Wound #1: Same as Pre-Procedure Plan Follow-up Appointments: Return Appointment in 1 week. - w/ Dr. Mikey Bussing and [place] Rm # 8 Tuesday 08/25/22 @ 3:45 Anesthetic: (In clinic) Topical Lidocaine 5% applied to wound bed Bathing/ Shower/ Hygiene: May shower with protection but do not get wound dressing(s) wet. Protect dressing(s) with water repellant cover (for example, large plastic bag) or a cast cover and may then take shower. Edema Control - Lymphedema / SCD / Other: Elevate legs to the level of the heart or above for 30 minutes daily and/or when sitting for 3-4 times a day throughout the day. Avoid standing for long periods of time. Off-Loading: Other: - float heels when sitting/laying. KRISANNE, ARAI ([id number]) [id number] Page 6 of 8 The following medication(s) was prescribed: lidocaine topical 5 % gel gel topical once daily for PRN debridements/pain was prescribed at facility WOUND #1: - Calcaneus Wound Laterality: Left Cleanser: Wound Cleanser (DME) (Generic) 1 x Per Day/15 Days Discharge Instructions: Cleanse the wound with wound cleanser prior to applying a clean dressing using gauze sponges, not tissue or cotton balls. Prim Dressing: Hydrofera Blue Ready Transfer Foam, 2.5x2.5 (in/in) (DME) (Generic) 1 x Per Day/15 Days ary Discharge Instructions: Apply directly to wound bed as directed Prim Dressing: MediHoney Gel,  tube 1.5 (oz) 1 x Per Day/15 Days ary Discharge Instructions: Apply to wound bed as instructed Secondary Dressing: Zetuvit Plus Silicone Border Dressing 5x5 (in/in) (DME) (Generic) 1 x Per Day/15 Days Discharge Instructions: Apply silicone border over primary dressing as directed. Secured With: Stretch Net Size 5, 10 (yds) (DME) (Generic) 1 x Per Day/15 Days 1. In office sharp debridement 2. Medihoney and Hydrofera Blue 3. Aggressive offloading 4. Follow-up in 1 week Electronic Signature(s) Signed: 08/21/2022 10:08:01 AM By: Geralyn Corwin DO Entered By: Geralyn Corwin on 08/21/2022 09:23:25 -------------------------------------------------------------------------------- HxROS Details Patient Name: Date of Service: [name]. 08/21/2022 8:00 A M Medical Record Number: [id number] Patient Account Number: [id number] Date of Birth/Sex: Treating RN: [birth year] (52 y.o. Deanna [name], Deanna [name] Primary Care Provider: Rodman Pickle Other Clinician: Referring Provider: Treating Provider/Extender: York Grice in Treatment: 0 Information Obtained From Patient Chart Constitutional Symptoms (General Health) Complaints and Symptoms: Negative for: Fatigue; Fever; Chills; Marked Weight Change Eyes Complaints and Symptoms: Negative for: Dry Eyes; Vision Changes; Glasses / Contacts Ear/Nose/Mouth/Throat Complaints and Symptoms: Negative for: Chronic sinus problems or rhinitis Respiratory Complaints and Symptoms: Negative for: Chronic or frequent coughs; Shortness of Breath Cardiovascular Complaints and Symptoms: Negative for: Chest pain Endocrine Complaints and Symptoms: Negative for: Heat/cold intolerance Integumentary (Skin) Complaints and Symptoms: Positive for: Wounds Musculoskeletal Complaints and Symptoms: Negative for: Muscle Pain; Muscle Weakness JAMAIYAH, RECHNER ([id number]) [id number] Page 7 of  8 Neurologic Complaints and Symptoms: Negative for: Numbness/parasthesias Psychiatric Complaints and Symptoms: Negative for: Claustrophobia Hematologic/Lymphatic Gastrointestinal Complaints and  Symptoms: Review of System Notes: GERD; ibs Genitourinary Complaints and Symptoms: Review of System Notes: ovarian cyst; yeast infection; bacterial vaginosis Immunological Oncologic Immunizations Pneumococcal Vaccine: Received Pneumococcal Vaccination: No Implantable Devices None Family and Social History Unknown History: Yes; Never smoker; Marital Status - Single; Alcohol Use: Never; Drug Use: No History; Caffeine Use: Rarely; Financial Concerns: No; Food, Clothing or Shelter Needs: No; Support System Lacking: No; Transportation Concerns: No Electronic Signature(s) Signed: 08/21/2022 9:13:40 AM By: Fonnie Mu RN Signed: 08/21/2022 10:08:01 AM By: Geralyn Corwin DO Entered By: Fonnie Mu on 08/21/2022 07:58:59 -------------------------------------------------------------------------------- SuperBill Details Patient Name: Date of Service: [place] DIJONAY, DIVAN 08/21/2022 Medical Record Number: [id number] Patient Account Number: [id number] Date of Birth/Sex: Treating RN: [birth year] (52 y.o. Deanna [name], Deanna [name] Primary Care Provider: Rodman Pickle Other Clinician: Referring Provider: Treating Provider/Extender: York Grice in Treatment: 0 Diagnosis Coding ICD-10 Codes Code Description [phone] Unspecified open wound, left foot, initial encounter T81.31XA Disruption of external operation (surgical) wound, not elsewhere classified, initial encounter D23.9 Other benign neoplasm of skin, unspecified Facility Procedures : 7 CPT4 Code: [id number] Description: 99214 - WOUND CARE VISIT-LEV 4 EST PT Modifier: Quantity: 1 : 3 Lacerte, T CPT4 Code: [id number] ERESA E ([id number]) Description: 11042 - DEB SUBQ TISSUE 20 SQ CM/< ICD-10 Diagnosis Description  S91.302A Unspecified open wound, left foot, initial encounter [id number] Modifier: 100091_Physician_ Quantity: 1 [id number].pdf Page 8 of 8 Physician Procedures : CPT4 Code Description Modifier [id number] - WC PHYS LEVEL 4 - NEW PT ICD-10 Diagnosis Description S91.302A Unspecified open wound, left foot, initial encounter T81.31XA Disruption of external operation (surgical) wound, not elsewhere classified,  initial encounter D23.9 Other benign neoplasm of skin, unspecified Quantity: 1 : [id number] 11042 - WC PHYS SUBQ TISS 20 SQ CM ICD-10 Diagnosis Description S91.302A Unspecified open wound, left foot, initial encounter Quantity: 1 Electronic Signature(s) Signed: 08/21/2022 10:08:01 AM By: Geralyn Corwin DO Previous Signature: 08/21/2022 9:13:40 AM Version By: Fonnie Mu RN Entered By: Geralyn Corwin on 08/21/2022 09:23:51

## 2022-08-22 NOTE — Progress Notes (Signed)
Deanna, [name] ([id number]) [id number] Page 1 of 4 Visit Report for 08/21/2022 Abuse Risk Screen Details Patient Name: Date of Service: Deanna [name], [name] 08/21/2022 8:00 A M Medical Record Number: [id number] Patient Account Number: [id number] Date of Birth/Sex: Treating RN: 02-10-1971 (52 y.o. Ardis Rowan, Lauren Primary Care Malee Grays: Rodman Pickle Other Clinician: Referring Astella Desir: Treating Sheniah Supak/Extender: York Grice in Treatment: 0 Abuse Risk Screen Items Answer ABUSE RISK SCREEN: Has anyone close to you tried to hurt or harm you recentlyo No Do you feel uncomfortable with anyone in your familyo No Has anyone forced you do things that you didnt want to doo No Electronic Signature(s) Signed: 08/21/2022 9:13:40 AM By: Fonnie Mu RN Entered By: Fonnie Mu on 08/21/2022 08:12:47 -------------------------------------------------------------------------------- Activities of Daily Living Details Patient Name: Date of Service: Deanna DEAUNDREA, [name] 08/21/2022 8:00 A M Medical Record Number: [id number] Patient Account Number: [id number] Date of Birth/Sex: Treating RN: [birth year] (52 y.o. Ardis Rowan, Lauren Primary Care Leveta Wahab: Rodman Pickle Other Clinician: Referring Jette Lewan: Treating Jamieson Lisa/Extender: York Grice in Treatment: 0 Activities of Daily Living Items Answer Activities of Daily Living (Please select one for each item) Drive Automobile Completely Able T Medications ake Completely Able Use T elephone Completely Able Care for Appearance Completely Able Use T oilet Completely Able Bath / Shower Completely Able Dress Self Completely Able Feed Self Completely Able Walk Completely Able Get In / Out Bed Completely Able Housework Completely Able Prepare Meals Completely Able Handle Money Completely Able Shop for Self Completely Able Electronic Signature(s) Signed:  08/21/2022 9:13:40 AM By: Fonnie Mu RN Entered By: Fonnie Mu on 08/21/2022 08:14:56 -------------------------------------------------------------------------------- Education Screening Details Patient Name: Date of Service: Deanna [name] 08/21/2022 8:00 A M Medical Record Number: [id number] Patient Account Number: [id number] Date of Birth/Sex: Treating RN: 1970/05/29 (52 y.o. Ardis Rowan, Lauren Primary Care Kenric Ginger: Rodman Pickle Other Clinician: Referring Dmani Mizer: Treating Reiner Loewen/Extender: York Grice in TreatmentSALIHAH, [name] ([id number]) [id number] Page 2 of 4 Primary Learner Assessed: Patient Learning Preferences/Education Level/Primary Language Learning Preference: Explanation, Demonstration, Communication Board, Printed Material Highest Education Level: [employer] or Above Preferred Language: Economist Language Barrier: No Translator Needed: No Memory Deficit: No Emotional Barrier: No Cultural/Religious Beliefs Affecting Medical Care: No Physical Barrier Impaired Vision: Yes Glasses Impaired Hearing: No Decreased Hand dexterity: No Knowledge/Comprehension Knowledge Level: High Comprehension Level: High Ability to understand written instructions: High Ability to understand verbal instructions: High Motivation Anxiety Level: Calm Cooperation: Cooperative Education Importance: Denies Need Interest in Health Problems: Asks Questions Perception: Coherent Willingness to Engage in Self-Management High Activities: Readiness to Engage in Self-Management High Activities: Electronic Signature(s) Signed: 08/21/2022 9:13:40 AM By: Fonnie Mu RN Entered By: Fonnie Mu on 08/21/2022 08:12:38 -------------------------------------------------------------------------------- Fall Risk Assessment Details Patient Name: Date of Service: Deanna [name]. 08/21/2022 8:00  A M Medical Record Number: [id number] Patient Account Number: [id number] Date of Birth/Sex: Treating RN: 1971-03-11 (53 y.o. Ardis Rowan, Lauren Primary Care Niguel Moure: Rodman Pickle Other Clinician: Referring Oskar Cretella: Treating Zaedyn Covin/Extender: York Grice in Treatment: 0 Fall Risk Assessment Items Have you had 2 or more falls in the last 12 monthso 0 No Have you had any fall that resulted in injury in the last 12 monthso 0 No FALLS RISK SCREEN History of falling - immediate or within 3 months 0 No Secondary diagnosis (Do you have 2 or more medical diagnoseso) 0 No Ambulatory aid None/bed rest/wheelchair/nurse 0 No Crutches/cane/walker 0  No Furniture 0 No Intravenous therapy Access/Saline/Heparin Lock 0 No Gait/Transferring Normal/ bed rest/ wheelchair 0 No Weak (short steps with or without shuffle, stooped but able to lift head while walking, may seek 0 No support from furniture) Impaired (short steps with shuffle, may have difficulty arising from chair, head down, impaired 0 No balance) Mental Status Oriented to own ability 0 No Overestimates or forgets limitations 0 No Risk Level: Low Risk Score: 0 Deanna, [name] ([id number]) [phone] [id number] Page 3 of 4 Electronic Signature(s) -------------------------------------------------------------------------------- Foot Assessment Details Patient Name: Date of Service: Deanna NIKESHIA, [name] 08/21/2022 8:00 A M Medical Record Number: [id number] Patient Account Number: [id number] Date of Birth/Sex: Treating RN: 05-25-1970 (52 y.o. Ardis Rowan, Lauren Primary Care Lonni Dirden: Rodman Pickle Other Clinician: Referring Mandee Pluta: Treating Shep Porter/Extender: York Grice in Treatment: 0 Foot Assessment Items Site Locations + = Sensation present, - = Sensation absent, C = Callus, U = Ulcer R = Redness, W = Warmth, M = Maceration, PU = Pre-ulcerative  lesion F = Fissure, S = Swelling, D = Dryness Assessment Right: Left: Other Deformity: No No Prior Foot Ulcer: No No Prior Amputation: No No Charcot Joint: No No Ambulatory Status: Ambulatory Without Help Gait: Steady Electronic Signature(s) Signed: 08/21/2022 9:13:40 AM By: Fonnie Mu RN Entered By: Fonnie Mu on 08/21/2022 08:30:49 -------------------------------------------------------------------------------- Nutrition Risk Screening Details Patient Name: Date of Service: Deanna [name] 08/21/2022 8:00 A M Medical Record Number: [id number] Patient Account Number: [id number] Date of Birth/Sex: Treating RN: 1970/10/12 (52 y.o. Ardis Rowan, Lauren Primary Care Preethi Scantlebury: Rodman Pickle Other Clinician: Referring Mauri Tolen: Treating Pharrah Rottman/Extender: Blanchie Dessert Weeks in Treatment: 0 Height (in): Weight (lbs): Body Mass Index (BMI): Deanna, [name] ([id number]) [phone] [id number] Page 4 of 4 Nutrition Risk Screening Items Score Screening NUTRITION RISK SCREEN: I have an illness or condition that made me change the kind and/or amount of food I eat 0 No I eat fewer than two meals per day 0 No I eat few fruits and vegetables, or milk products 0 No I have three or more drinks of beer, liquor or wine almost every day 0 No I have tooth or mouth problems that make it hard for me to eat 0 No I don't always have enough money to buy the food I need 0 No I eat alone most of the time 0 No I take three or more different prescribed or over-the-counter drugs a day 0 No Without wanting to, I have lost or gained 10 pounds in the last six months 0 No I am not always physically able to shop, cook and/or feed myself 0 No Nutrition Protocols Good Risk Protocol 0 No interventions needed Moderate Risk Protocol High Risk Proctocol Risk Level: Good Risk Score: 0 Electronic Signature(s) Signed: 08/21/2022 9:13:40 AM By: Fonnie Mu RN Entered By: Fonnie Mu on 08/21/2022 08:12:02

## 2022-08-22 NOTE — Progress Notes (Signed)
Deanna [name], Deanna [name] ([id number]) [id number] Page 1 of 8 Visit Report for 08/21/2022 Allergy List Details Patient Name: Date of Service: Deanna [name], Deanna [name] 08/21/2022 8:00 A M Medical Record Number: [id number] Patient Account Number: [id number] Date of Birth/Sex: Treating RN: 1970/08/11 (52 y.o. Deanna [name], Lauren Primary Care Aniella Wandrey: Rodman Pickle Other Clinician: Referring Atlas Crossland: Treating Rogenia Werntz/Extender: Claris Gladden, Lauren Weeks in Treatment: 0 Allergies Active Allergies No Known Allergies Allergy Notes Electronic Signature(s) Signed: 08/21/2022 9:13:40 AM By: Fonnie Mu RN Entered By: Fonnie Mu on 08/21/2022 07:57:31 -------------------------------------------------------------------------------- Arrival Information Details Patient Name: Date of Service: Deanna [name] 08/21/2022 8:00 A M Medical Record Number: [id number] Patient Account Number: [id number] Date of Birth/Sex: Treating RN: [birth year] (52 y.o. Deanna [name], Lauren Primary Care Apollonia Amini: Rodman Pickle Other Clinician: Referring Lissandra Keil: Treating Cleona Doubleday/Extender: York Grice in Treatment: 0 Visit Information Patient Arrived: Ambulatory Arrival Time: 08:11 Accompanied By: self Transfer Assistance: None Patient Identification Verified: Yes Secondary Verification Process Completed: Yes Patient Requires Transmission-Based Precautions: No Patient Has Alerts: No Electronic Signature(s) Signed: 08/21/2022 9:13:40 AM By: Fonnie Mu RN Entered By: Fonnie Mu on 08/21/2022 08:11:43 -------------------------------------------------------------------------------- Clinic Level of Care Assessment Details Patient Name: Date of Service: Deanna Deanna [name], Deanna [name] 08/21/2022 8:00 A M Medical Record Number: [id number] Patient Account Number: [id number] Date of Birth/Sex: Treating RN: Oct 12, 1970 (52 y.o. Deanna [name], Lauren Primary Care  Rayshun Kandler: Rodman Pickle Other Clinician: Referring Manning Luna: Treating Alias Villagran/Extender: York Grice in Treatment: 0 Clinic Level of Care Assessment Items TOOL 3 Quantity Score X- 1 0 Use when EandM and Procedure is performed on FOLLOW-UP visit ASSESSMENTS - Nursing Assessment / Reassessment X- 1 10 Reassessment of Co-morbidities (includes updates in patient status) X- 1 5 Reassessment of Adherence to Treatment Plan JAXIE, SOKOLOWSKI ([id number]) [id number] Page 2 of 8 ASSESSMENTS - Wound and Skin Assessment / Reassessment []  - Points for Wound Assessment can only be taken for a new wound of unknown or different etiology and a procedure is 0 NOT performed to that wound X- 1 5 Simple Wound Assessment / Reassessment - one wound []  - 0 Complex Wound Assessment / Reassessment - multiple wounds []  - 0 Dermatologic / Skin Assessment (not related to wound area) ASSESSMENTS - Focused Assessment X- 1 5 Circumferential Edema Measurements - multi extremities []  - 0 Nutritional Assessment / Counseling / Intervention []  - 0 Lower Extremity Assessment (monofilament, tuning fork, pulses) []  - 0 Peripheral Arterial Disease Assessment (using hand held doppler) ASSESSMENTS - Ostomy and/or Continence Assessment and Care []  - 0 Incontinence Assessment and Management []  - 0 Ostomy Care Assessment and Management (repouching, etc.) PROCESS - Coordination of Care []  - Points for Discharge Coordination can only be taken for a new wound of unknown or different etiology and a procedure 0 is NOT performed to that wound X- 1 15 Simple Patient / Family Education for ongoing care []  - 0 Complex (extensive) Patient / Family Education for ongoing care X- 1 10 Staff obtains Chiropractor, Records, T Results / Process Orders est []  - 0 Staff telephones HHA, Nursing Homes / Clarify orders / etc []  - 0 Routine Transfer to another Facility (non-emergent  condition) []  - 0 Routine Hospital Admission (non-emergent condition) X- 1 15 New Admissions / Manufacturing engineer / Ordering NPWT Apligraf, etc. , []  - 0 Emergency Hospital Admission (emergent condition) X- 1 10 Simple Discharge Coordination []  - 0 Complex (extensive) Discharge Coordination PROCESS - Special Needs []  - 0 Pediatric / Minor Patient  Management []  - 0 Isolation Patient Management []  - 0 Hearing / Language / Visual special needs []  - 0 Assessment of Community assistance (transportation, D/C planning, etc.) []  - 0 Additional assistance / Altered mentation []  - 0 Support Surface(s) Assessment (bed, cushion, seat, etc.) INTERVENTIONS - Wound Cleansing / Measurement []  - Points for Wound Cleaning / Measurement, Wound Dressing, Specimen Collection and Specimen taken to lab can only 0 be taken for a new wound of unknown or different etiology and a procedure is NOT performed to that wound X- 1 5 Simple Wound Cleansing - one wound []  - 0 Complex Wound Cleansing - multiple wounds X- 1 5 Wound Imaging (photographs - any number of wounds) []  - 0 Wound Tracing (instead of photographs) X- 1 5 Simple Wound Measurement - one wound []  - 0 Complex Wound Measurement - multiple wounds INTERVENTIONS - Wound Dressings X - Small Wound Dressing one or multiple wounds 1 10 []  - 0 Medium Wound Dressing one or multiple wounds Deanna [name], Deanna [name] ([id number]) [id number] Page 3 of 8 []  - 0 Large Wound Dressing one or multiple wounds INTERVENTIONS - Miscellaneous []  - 0 External ear exam []  - 0 Specimen Collection (cultures, biopsies, blood, body fluids, etc.) []  - 0 Specimen(s) / Culture(s) sent or taken to Lab for analysis []  - 0 Patient Transfer (multiple staff / Michiel Sites Lift / Similar devices) []  - 0 Simple Staple / Suture removal (25 or less) []  - 0 Complex Staple / Suture removal (26 or more) []  - 0 Hypo / Hyperglycemic Management (close  monitor of Blood Glucose) X- 1 15 Ankle / Brachial Index (ABI) - do not check if billed separately X- 1 5 Vital Signs Has the patient been seen at the hospital within the last three years: Yes Total Score: 120 Level Of Care: New/Established - Level 4 Electronic Signature(s) Signed: 08/21/2022 9:13:40 AM By: Fonnie Mu RN Entered By: Fonnie Mu on 08/21/2022 09:12:43 -------------------------------------------------------------------------------- Encounter Discharge Information Details Patient Name: Date of Service: Deanna [name]. 08/21/2022 8:00 A M Medical Record Number: [id number] Patient Account Number: [id number] Date of Birth/Sex: Treating RN: 1970-07-08 (52 y.o. Deanna [name], Lauren Primary Care Dohn Stclair: Rodman Pickle Other Clinician: Referring Keslyn Teater: Treating Alexx Giambra/Extender: York Grice in Treatment: 0 Encounter Discharge Information Items Post Procedure Vitals Discharge Condition: Stable Temperature (F): 98.7 Ambulatory Status: Ambulatory Pulse (bpm): 74 Discharge Destination: Home Respiratory Rate (breaths/min): 17 Transportation: Private Auto Blood Pressure (mmHg): 120/80 Accompanied By: self Schedule Follow-up Appointment: Yes Clinical Summary of Care: Patient Declined Electronic Signature(s) Signed: 08/21/2022 9:13:40 AM By: Fonnie Mu RN Entered By: Fonnie Mu on 08/21/2022 09:13:25 -------------------------------------------------------------------------------- Lower Extremity Assessment Details Patient Name: Date of Service: Deanna [name] 08/21/2022 8:00 A M Medical Record Number: [id number] Patient Account Number: [id number] Date of Birth/Sex: Treating RN: [birth year] (52 y.o. Deanna [name], Lauren Primary Care Noelly Lasseigne: Rodman Pickle Other Clinician: Referring Jamyah Folk: Treating Aveen Stansel/Extender: Claris Gladden, Lauren Weeks in Treatment: 0 Edema Assessment Assessed: Kyra Searles: Yes]  [Right: No] Edema: [Left: N] [Right: o] Calf Left: Right: Point of Measurement: From Medial Instep 37 cm Ankle JULISSA, TARBOX ([id number]) [id number] Page 4 of 8 Left: Right: Point of Measurement: From Medial Instep 24 cm Vascular Assessment Pulses: Dorsalis Pedis Palpable: [Left:Yes] Posterior Tibial Palpable: [Left:Yes] Blood Pressure: Brachial: [Left:144] Ankle: [Left:Dorsalis Pedis: 130 0.90] Electronic Signature(s) Signed: 08/21/2022 9:13:40 AM By: Fonnie Mu RN Entered By: Fonnie Mu on 08/21/2022 08:35:41 -------------------------------------------------------------------------------- Multi Wound Chart Details Patient Name: Date of Service: Deanna  Deanna [name], Deanna [name] 08/21/2022 8:00 A M Medical Record Number: [id number] Patient Account Number: [id number] Date of Birth/Sex: Treating RN: [birth year] (52 y.o. F) Primary Care Latoia Eyster: Rodman Pickle Other Clinician: Referring Brenn Deziel: Treating Esaw Knippel/Extender: Claris Gladden, Lauren Weeks in Treatment: 0 Vital Signs Height(in): Pulse(bpm): 80 Weight(lbs): Blood Pressure(mmHg): 144/89 Body Mass Index(BMI): Temperature(F): 98.2 Respiratory Rate(breaths/min): 17 [1:Photos: No Photos Left Calcaneus Wound Location: Gradually Appeared Wounding Event: Open Surgical Wound Primary Etiology: 07/28/2022 Date Acquired: 0 Weeks of Treatment: Open Wound Status: No Wound Recurrence: 1.5x2.4x0.3 Measurements L x W x D (cm) 2.827 A (cm)  : rea 0.848 Volume (cm) : Full Thickness With Exposed Support N/A Classification: Structures Medium Exudate Amount: Serosanguineous Exudate Type: red, brown Exudate Color: Distinct, outline attached Wound Margin: Medium (34-66%) Granulation Amount: Red,  Pink Granulation Quality: Medium (34-66%) Necrotic Amount: Fat Layer (Subcutaneous Tissue): Yes N/A Exposed Structures: Fascia: No Tendon: No Muscle: No Joint: No Bone: No Debridement - Excisional Debridement:  Pre-procedure Verification/Time Out 08:53  Taken: Lidocaine Pain Control: Subcutaneous, Slough Tissue Debrided: Skin/Subcutaneous Tissue Level: 2.83 Debridement A (sq cm): rea Curette Instrument: Minimum Bleeding: Pressure Hemostasis A chieved: 0 Procedural Pain:] [N/A:N/A N/A N/A N/A N/A N/A N/A  N/A N/A N/A N/A N/A N/A N/A N/A N/A N/A N/A N/A N/A N/A N/A N/A N/A N/A N/A N/A N/A] KAIDYNCE, SPYKER ([id number]) [1:0 Post Procedural Pain: Procedure was tolerated well Debridement Treatment Response: 1.5x2.4x0.3 Post Debridement Measurements L x W x D (cm) 0.848 Post Debridement Volume: (cm) Excoriation: No Periwound Skin Texture: Induration: No Callus: No  Crepitus: No Rash: No Scarring: No Maceration: No Periwound Skin Moisture: Dry/Scaly: No Atrophie Blanche: No Periwound Skin Color: Cyanosis: No Ecchymosis: No Erythema: No Hemosiderin Staining: No Mottled: No Pallor: No Rubor: No No Abnormality  Temperature: Yes Tenderness on Palpation: Debridement Procedures Performed:] [N/A:N/A N/A N/A N/A N/A N/A N/A N/A N/A N/A] Treatment Notes Electronic Signature(s) Signed: 08/21/2022 10:08:01 AM By: Geralyn Corwin DO Entered By: Geralyn Corwin on 08/21/2022 09:00:56 -------------------------------------------------------------------------------- Multi-Disciplinary Care Plan Details Patient Name: Date of Service: Deanna [name]. 08/21/2022 8:00 A M Medical Record Number: [id number] Patient Account Number: [id number] Date of Birth/Sex: Treating RN: 07/11/1970 (52 y.o. Deanna [name], Lauren Primary Care Ameria Sanjurjo: Rodman Pickle Other Clinician: Referring Corabelle Spackman: Treating Nava Song/Extender: York Grice in Treatment: 0 Active Inactive Orientation to the Wound Care Program Nursing Diagnoses: Knowledge deficit related to the wound healing center program Goals: Patient/caregiver will verbalize understanding of the Wound Healing Center Program Date Initiated: 08/21/2022 Target  Resolution Date: 09/25/2022 Goal Status: Active Interventions: Provide education on orientation to the wound center Notes: Wound/Skin Impairment Nursing Diagnoses: Impaired tissue integrity Knowledge deficit related to ulceration/compromised skin integrity Goals: Patient will have a decrease in wound volume by X% from date: (specify in notes) Date Initiated: 08/21/2022 Target Resolution Date: 09/26/2022 Goal Status: Active Patient/caregiver will verbalize understanding of skin care regimen Date Initiated: 08/21/2022 Target Resolution Date: 09/24/2022 Goal Status: Active Ulcer/skin breakdown will have a volume reduction of 30% by week 4 Date Initiated: 08/21/2022 Target Resolution Date: 09/24/2022 Goal Status: Active Deanna [name], Deanna [name] ([id number]) [phone].pdf Page 6 of 8 Ulcer/skin breakdown will have a volume reduction of 50% by week 8 Date Initiated: 08/21/2022 Target Resolution Date: 09/24/2022 Goal Status: Active Interventions: Assess patient/caregiver ability to obtain necessary supplies Assess patient/caregiver ability to perform ulcer/skin care regimen upon admission and as needed Assess ulceration(s) every visit Notes: Electronic Signature(s) Signed: 08/21/2022 9:13:40 AM By: Fonnie Mu RN Entered By: Fonnie Mu  on 08/21/2022 08:52:34 -------------------------------------------------------------------------------- Pain Assessment Details Patient Name: Date of Service: Deanna [name], Deanna [name] 08/21/2022 8:00 A M Medical Record Number: [id number] Patient Account Number: [id number] Date of Birth/Sex: Treating RN: 1970-09-16 (53 y.o. Deanna [name], Lauren Primary Care Swayze Pries: Rodman Pickle Other Clinician: Referring Darshay Deupree: Treating Ziggy Chanthavong/Extender: York Grice in Treatment: 0 Active Problems Location of Pain Severity and Description of Pain Patient Has Paino No Site Locations Pain Management and Medication Current Pain  Management: Electronic Signature(s) Signed: 08/21/2022 9:13:40 AM By: Fonnie Mu RN Entered By: Fonnie Mu on 08/21/2022 08:11:53 -------------------------------------------------------------------------------- Patient/Caregiver Education Details Patient Name: Date of Service: Deanna Deanna [name], Deanna E. 5/3/2024andnbsp8:00 A M Medical Record Number: [id number] Patient Account Number: [id number] Date of Birth/Gender: Treating RN: May 25, 1970 (52 y.o. Deanna [name], Lauren Primary Care Physician: Rodman Pickle Other Clinician: Referring Physician: Treating Physician/Extender: York Grice in Treatment: 0 Education [address] TANGANYIKA, CAULDER ([id number]) [id number] Page 7 of 8 Education Provided To: Patient Education Topics Provided Wound/Skin Impairment: Methods: Explain/Verbal Responses: Reinforcements needed, State content correctly Electronic Signature(s) Signed: 08/21/2022 9:13:40 AM By: Fonnie Mu RN Entered By: Fonnie Mu on 08/21/2022 08:52:43 -------------------------------------------------------------------------------- Wound Assessment Details Patient Name: Date of Service: Deanna [name] 08/21/2022 8:00 A M Medical Record Number: [id number] Patient Account Number: [id number] Date of Birth/Sex: Treating RN: Dec 16, 1970 (52 y.o. Deanna [name], Lauren Primary Care Maicee Ullman: Rodman Pickle Other Clinician: Referring Airika Alkhatib: Treating Suhaila Troiano/Extender: Blanchie Dessert Weeks in Treatment: 0 Wound Status Wound Number: 1 Primary Etiology: Open Surgical Wound Wound Location: Left Calcaneus Wound Status: Open Wounding Event: Gradually Appeared Date Acquired: 07/28/2022 Weeks Of Treatment: 0 Clustered Wound: No Wound Measurements Length: (cm) 1.5 Width: (cm) 2.4 Depth: (cm) 0.3 Area: (cm) 2.827 Volume: (cm) 0.848 % Reduction in Area: % Reduction in Volume: Tunneling:  No Undermining: No Wound Description Classification: Full Thickness With Exposed Suppo Wound Margin: Distinct, outline attached Exudate Amount: Medium Exudate Type: Serosanguineous Exudate Color: red, brown rt Structures Foul Odor After Cleansing: No Slough/Fibrino Yes Wound Bed Granulation Amount: Medium (34-66%) Exposed Structure Granulation Quality: Red, Pink Fascia Exposed: No Necrotic Amount: Medium (34-66%) Fat Layer (Subcutaneous Tissue) Exposed: Yes Necrotic Quality: Adherent Slough Tendon Exposed: No Muscle Exposed: No Joint Exposed: No Bone Exposed: No Periwound Skin Texture Texture Color No Abnormalities Noted: No No Abnormalities Noted: No Callus: No Atrophie Blanche: No Crepitus: No Cyanosis: No Excoriation: No Ecchymosis: No Induration: No Erythema: No Rash: No Hemosiderin Staining: No Scarring: No Mottled: No Pallor: No Moisture Rubor: No No Abnormalities Noted: No Dry / Scaly: No Temperature / Pain Maceration: No Temperature: No Abnormality Tenderness on PalpationVESA, Deanna [name] ([id number]) [id number] Page 8 of 8 Treatment Notes Wound #1 (Calcaneus) Wound Laterality: Left Cleanser Wound Cleanser Discharge Instruction: Cleanse the wound with wound cleanser prior to applying a clean dressing using gauze sponges, not tissue or cotton balls. Peri-Wound Care Topical Primary Dressing Hydrofera Blue Ready Transfer Foam, 2.5x2.5 (in/in) Discharge Instruction: Apply directly to wound bed as directed MediHoney Gel, tube 1.5 (oz) Discharge Instruction: Apply to wound bed as instructed Secondary Dressing Woven Gauze Sponge, Non-Sterile 4x4 in Discharge Instruction: Apply over primary dressing as directed. Zetuvit Plus Silicone Border Dressing 5x5 (in/in) Discharge Instruction: Apply silicone border over primary dressing as directed. Secured With Compression Wrap Compression Stockings Geologist, engineering) Signed: 08/21/2022 9:13:40 AM By: Fonnie Mu RN Entered By: Fonnie Mu on 08/21/2022 08:30:02 -------------------------------------------------------------------------------- Vitals Details Patient Name: Date of Service: Deanna Deanna Lav E. 08/21/2022 8:00 A  M Medical Record Number: [id number] Patient Account Number: [id number] Date of Birth/Sex: Treating RN: 1970/06/28 (52 y.o. Deanna [name], Lauren Primary Care Aland Chestnutt: Rodman Pickle Other Clinician: Referring Foster Sonnier: Treating Subrina Vecchiarelli/Extender: York Grice in Treatment: 0 Vital Signs Time Taken: 08:14 Temperature (F): 98.2 Pulse (bpm): 80 Respiratory Rate (breaths/min): 17 Blood Pressure (mmHg): 144/89 Reference Range: 80 - 120 mg / dl Electronic Signature(s) Signed: 08/21/2022 9:13:40 AM By: Fonnie Mu RN Entered By: Fonnie Mu on 08/21/2022 08:14:39

## 2022-08-25 ENCOUNTER — Encounter (HOSPITAL_BASED_OUTPATIENT_CLINIC_OR_DEPARTMENT_OTHER): Payer: No Typology Code available for payment source | Admitting: Internal Medicine

## 2022-08-25 DIAGNOSIS — T8131XA Disruption of external operation (surgical) wound, not elsewhere classified, initial encounter: Secondary | ICD-10-CM | POA: Diagnosis not present

## 2022-09-03 ENCOUNTER — Encounter (HOSPITAL_BASED_OUTPATIENT_CLINIC_OR_DEPARTMENT_OTHER): Payer: No Typology Code available for payment source | Admitting: Internal Medicine

## 2022-09-03 DIAGNOSIS — T8131XA Disruption of external operation (surgical) wound, not elsewhere classified, initial encounter: Secondary | ICD-10-CM

## 2022-09-03 DIAGNOSIS — S91302A Unspecified open wound, left foot, initial encounter: Secondary | ICD-10-CM

## 2022-09-07 ENCOUNTER — Encounter (HOSPITAL_BASED_OUTPATIENT_CLINIC_OR_DEPARTMENT_OTHER): Payer: No Typology Code available for payment source | Admitting: Internal Medicine

## 2022-09-10 ENCOUNTER — Encounter (HOSPITAL_BASED_OUTPATIENT_CLINIC_OR_DEPARTMENT_OTHER): Payer: No Typology Code available for payment source | Admitting: Internal Medicine

## 2022-09-10 DIAGNOSIS — T8131XA Disruption of external operation (surgical) wound, not elsewhere classified, initial encounter: Secondary | ICD-10-CM | POA: Diagnosis not present

## 2022-09-10 DIAGNOSIS — S91302A Unspecified open wound, left foot, initial encounter: Secondary | ICD-10-CM | POA: Diagnosis not present

## 2022-09-17 ENCOUNTER — Encounter (HOSPITAL_BASED_OUTPATIENT_CLINIC_OR_DEPARTMENT_OTHER): Payer: No Typology Code available for payment source | Admitting: Internal Medicine

## 2022-09-17 DIAGNOSIS — T8131XA Disruption of external operation (surgical) wound, not elsewhere classified, initial encounter: Secondary | ICD-10-CM

## 2022-09-17 DIAGNOSIS — S91302A Unspecified open wound, left foot, initial encounter: Secondary | ICD-10-CM

## 2022-09-17 DIAGNOSIS — D239 Other benign neoplasm of skin, unspecified: Secondary | ICD-10-CM

## 2022-09-18 ENCOUNTER — Ambulatory Visit (INDEPENDENT_AMBULATORY_CARE_PROVIDER_SITE_OTHER): Payer: No Typology Code available for payment source | Admitting: Nurse Practitioner

## 2022-09-18 ENCOUNTER — Encounter: Payer: Self-pay | Admitting: Nurse Practitioner

## 2022-09-18 VITALS — BP 118/80 | HR 77 | Temp 98.3°F | Ht 68.75 in | Wt 179.6 lb

## 2022-09-18 DIAGNOSIS — R42 Dizziness and giddiness: Secondary | ICD-10-CM | POA: Diagnosis not present

## 2022-09-18 DIAGNOSIS — Z0000 Encounter for general adult medical examination without abnormal findings: Secondary | ICD-10-CM

## 2022-09-18 NOTE — Assessment & Plan Note (Signed)
Health maintenance reviewed and updated. Discussed nutrition, exercise. Requesting recent labs from [place]. Follow-up 1 year.

## 2022-09-18 NOTE — Progress Notes (Signed)
BP 118/80 (BP Location: Left Arm)   Pulse 77   Temp 98.3 F (36.8 C)   Ht 5' 8.75" (1.746 m)   Wt 179 lb 9.6 oz (81.5 kg)   LMP 05/25/2015 (Approximate)   SpO2 98%   BMI 26.72 kg/m    Subjective:    Patient ID: Deanna [name], female    DOB: 1970/12/03, 52 y.o.   MRN: [id number]  CC: Chief Complaint  Patient presents with   Annual Exam    Patient is not fasting, concerns with lightheaded at times    HPI: Deanna [name] is a 52 y.o. female presenting on 09/18/2022 for comprehensive medical examination. Current medical complaints include: light headedness at times  She states that she will get light-headed sometimes when she is bending over or when she is squatting and goes to stand up. She tries to drink a lot of water, with some days more than others. She denies chest pain and shortness of breath.   She currently lives with: alone Menopausal Symptoms:  hot flashes  Depression and Anxiety Screen done today and results listed below:     03/25/2022    4:09 PM 04/18/2019   11:29 AM 01/13/2019    1:22 PM 12/29/2018   10:45 AM 05/25/2018   10:23 AM  Depression screen PHQ 2/9  Decreased Interest 0 0 0 0 0  Down, Depressed, Hopeless 0 0 0 0 0  PHQ - 2 Score 0 0 0 0 0  Altered sleeping 0      Tired, decreased energy 2      Change in appetite 2      Feeling bad or failure about yourself  0      Trouble concentrating 0      Moving slowly or fidgety/restless 0      Suicidal thoughts 0      PHQ-9 Score 4      Difficult doing work/chores Not difficult at all          03/25/2022    4:09 PM  GAD 7 : Generalized Anxiety Score  Nervous, Anxious, on Edge 1  Control/stop worrying 0  Worry too much - different things 0  Trouble relaxing 0  Restless 0  Easily annoyed or irritable 1  Afraid - awful might happen 0  Total GAD 7 Score 2  Anxiety Difficulty Not difficult at all    The patient does not have a history of falls. I did not complete a risk assessment for falls. A plan  of care for falls was not documented.   Past Medical History:  Past Medical History:  Diagnosis Date   Allergy    BV (bacterial vaginosis)    GERD (gastroesophageal reflux disease)    HSV-1 infection    IBS (irritable bowel syndrome)    Lipoma    Near syncope 02/21/2014   Ovarian cyst    Syncope 02/21/2014   Yeast infection     Surgical History:  Past Surgical History:  Procedure Laterality Date   BREAST BIOPSY  04/20/2006   LAPAROSCOPY  04/20/1989   no endometriosis found   LIPOMA EXCISION Right 1994   shoulder   SUPRACERVICAL ABDOMINAL HYSTERECTOMY N/A 07/09/2015   Procedure: HYSTERECTOMY SUPRACERVICAL ABDOMINAL with Bilateral Salpingectomy;  Surgeon: Hal Morales, MD;  Location: [facility];  Service: Gynecology;  Laterality: N/A;    Medications:  Current Outpatient Medications on File Prior to Visit  Medication Sig   cetirizine (ZYRTEC) 10 MG tablet Take 10 mg  by mouth daily.   UNABLE TO FIND Med Name: OTC Vitamin D3   UNABLE TO FIND Med Name: Vitamin B12   valACYclovir (VALTREX) 1000 MG tablet Take 1,000 mg by mouth as needed (for cold sores).   Fluocinolone Acetonide Scalp 0.01 % OIL PLEASE SEE ATTACHED FOR DETAILED DIRECTIONS (Patient not taking: Reported on 09/18/2022)   ketoconazole (NIZORAL) 2 % shampoo PLEASE SEE ATTACHED FOR DETAILED DIRECTIONS (Patient not taking: Reported on 09/18/2022)   Multiple Vitamins-Minerals (WOMENS 50+ MULTI VITAMIN/MIN) TABS Take by mouth. (Patient not taking: Reported on 09/18/2022)   No current facility-administered medications on file prior to visit.    Allergies:  Allergies  Allergen Reactions   Dust Mite Extract Itching and Other (See Comments)    Social History:  Social History   Socioeconomic History   Marital status: Single    Spouse name: Not on file   Number of children: 1   Years of education: BS   Highest education level: Not on file  Occupational History   Occupation: Financial planner  Tobacco Use   Smoking  status: Never    Passive exposure: Never   Smokeless tobacco: Never  Vaping Use   Vaping Use: Never used  Substance and Sexual Activity   Alcohol use: Yes    Comment: wine occassional   Drug use: No   Sexual activity: Yes    Birth control/protection: Condom    Comment: orsythia   Other Topics Concern   Not on file  Social History Narrative   Patient drinks coffee and tea 3x a week.   Social Determinants of Health   Financial Resource Strain: Not on file  Food Insecurity: Not on file  Transportation Needs: Not on file  Physical Activity: Not on file  Stress: Not on file  Social Connections: Not on file  Intimate Partner Violence: Not on file   Social History   Tobacco Use  Smoking Status Never   Passive exposure: Never  Smokeless Tobacco Never   Social History   Substance and Sexual Activity  Alcohol Use Yes   Comment: wine occassional    Family History:  Family History  Problem Relation Age of Onset   Anemia Mother    Throat cancer Paternal Uncle    Cancer Paternal Uncle        prostate   Heart disease Maternal Grandmother    Hypertension Maternal Grandmother    Cancer Paternal Grandfather    Sickle cell trait Cousin     Past medical history, surgical history, medications, allergies, family history and social history reviewed with patient today and changes made to appropriate areas of the chart.   Review of Systems  Constitutional: Negative.   HENT: Negative.    Eyes: Negative.   Respiratory: Negative.    Cardiovascular: Negative.   Gastrointestinal: Negative.   Genitourinary: Negative.   Musculoskeletal: Negative.   Skin: Negative.   Neurological:  Positive for dizziness (at times with position changes).  Psychiatric/Behavioral: Negative.     All other ROS negative except what is listed above and in the HPI.      Objective:    BP 118/80 (BP Location: Left Arm)   Pulse 77   Temp 98.3 F (36.8 C)   Ht 5' 8.75" (1.746 m)   Wt 179 lb 9.6 oz  (81.5 kg)   LMP 05/25/2015 (Approximate)   SpO2 98%   BMI 26.72 kg/m   Wt Readings from Last 3 Encounters:  09/18/22 179 lb 9.6 oz (81.5 kg)  06/09/22 179 lb (81.2 kg)  04/03/22 175 lb (79.4 kg)    Physical Exam Vitals and nursing note reviewed.  Constitutional:      General: She is not in acute distress.    Appearance: Normal appearance.  HENT:     Head: Normocephalic and atraumatic.     Right Ear: Tympanic membrane, ear canal and external ear normal.     Left Ear: Tympanic membrane, ear canal and external ear normal.  Eyes:     Conjunctiva/sclera: Conjunctivae normal.  Cardiovascular:     Rate and Rhythm: Normal rate and regular rhythm.     Pulses: Normal pulses.     Heart sounds: Normal heart sounds.  Pulmonary:     Effort: Pulmonary effort is normal.     Breath sounds: Normal breath sounds.  Abdominal:     Palpations: Abdomen is soft.     Tenderness: There is no abdominal tenderness.  Musculoskeletal:        General: Normal range of motion.     Cervical back: Normal range of motion and neck supple.     Right lower leg: No edema.     Left lower leg: No edema.  Lymphadenopathy:     Cervical: No cervical adenopathy.  Skin:    General: Skin is warm and dry.     Comments: Healing surgical incision to back of left heel that is covered  Neurological:     General: No focal deficit present.     Mental Status: She is alert and oriented to person, place, and time.     Cranial Nerves: No cranial nerve deficit.     Coordination: Coordination normal.     Gait: Gait normal.  Psychiatric:        Mood and Affect: Mood normal.        Behavior: Behavior normal.        Thought Content: Thought content normal.        Judgment: Judgment normal.     Results for orders placed or performed in visit on 06/09/22  Cytology - PAP  Result Value Ref Range   High risk HPV Negative    Adequacy      Satisfactory for evaluation; transformation zone component PRESENT.   Diagnosis      -  Negative for intraepithelial lesion or malignancy (NILM)   Comment Normal Reference Range HPV - Negative       Assessment & Plan:   Problem List Items Addressed This Visit       Other   Routine general medical examination at a health care facility - Primary    Health maintenance reviewed and updated. Discussed nutrition, exercise. Requesting recent labs from [place]. Follow-up 1 year.        Episodic lightheadedness    She has been experiencing lightheadedness when she stands too quickly or goes from a squatting to standing position.  Discussed changing positions slowly and making sure that she drinks plenty of fluids.  Follow-up as symptoms worsen or with any concerns.        Follow up plan: Return in about 1 year (around 09/18/2023) for CPE.   LABORATORY TESTING:  - Pap smear: up to date  IMMUNIZATIONS:   - Tdap: Tetanus vaccination status reviewed: last tetanus booster within 10 years. - Influenza: Postponed to flu season - Pneumovax: Not applicable - Prevnar: Not applicable - HPV: Not applicable - Zostavax vaccine: Up to date  SCREENING: -Mammogram: Up to date  - Colonoscopy: Done elsewhere  -  Bone Density: Not applicable   PATIENT COUNSELING:   Advised to take 1 mg of folate supplement per day if capable of pregnancy.   Sexuality: Discussed sexually transmitted diseases, partner selection, use of condoms, avoidance of unintended pregnancy  and contraceptive alternatives.   Advised to avoid cigarette smoking.  I discussed with the patient that most people either abstain from alcohol or drink within safe limits (<=14/week and <=4 drinks/occasion for males, <=7/weeks and <= 3 drinks/occasion for females) and that the risk for alcohol disorders and other health effects rises proportionally with the number of drinks per week and how often a drinker exceeds daily limits.  Discussed cessation/primary prevention of drug use and availability of treatment for abuse.   Diet:  Encouraged to adjust caloric intake to maintain  or achieve ideal body weight, to reduce intake of dietary saturated fat and total fat, to limit sodium intake by avoiding high sodium foods and not adding table salt, and to maintain adequate dietary potassium and calcium preferably from fresh fruits, vegetables, and low-fat dairy products.    stressed the importance of regular exercise  Injury prevention: Discussed safety belts, safety helmets, smoke detector, smoking near bedding or upholstery.   Dental health: Discussed importance of regular tooth brushing, flossing, and dental visits.    NEXT PREVENTATIVE PHYSICAL DUE IN 1 YEAR. Return in about 1 year (around 09/18/2023) for CPE.

## 2022-09-18 NOTE — Assessment & Plan Note (Signed)
She has been experiencing lightheadedness when she stands too quickly or goes from a squatting to standing position.  Discussed changing positions slowly and making sure that she drinks plenty of fluids.  Follow-up as symptoms worsen or with any concerns.

## 2022-09-18 NOTE — Patient Instructions (Signed)
It was great to see you!  Keep up the great work!   Let's follow-up in 1 year, sooner if you have concerns.  If a referral was placed today, you will be contacted for an appointment. Please note that routine referrals can sometimes take up to 3-4 weeks to process. Please call our office if you haven't heard anything after this time frame.  Take care,  Lyndsay Talamante, NP  

## 2022-09-23 LAB — COLOGUARD: Cologuard: NEGATIVE

## 2022-09-24 ENCOUNTER — Ambulatory Visit (HOSPITAL_BASED_OUTPATIENT_CLINIC_OR_DEPARTMENT_OTHER): Payer: No Typology Code available for payment source | Admitting: Internal Medicine

## 2022-09-28 ENCOUNTER — Encounter (HOSPITAL_BASED_OUTPATIENT_CLINIC_OR_DEPARTMENT_OTHER): Payer: No Typology Code available for payment source | Attending: Internal Medicine | Admitting: Internal Medicine

## 2022-09-28 DIAGNOSIS — S91302A Unspecified open wound, left foot, initial encounter: Secondary | ICD-10-CM

## 2022-09-28 DIAGNOSIS — D239 Other benign neoplasm of skin, unspecified: Secondary | ICD-10-CM | POA: Diagnosis not present

## 2022-09-28 DIAGNOSIS — T8189XA Other complications of procedures, not elsewhere classified, initial encounter: Secondary | ICD-10-CM | POA: Diagnosis present

## 2022-09-28 DIAGNOSIS — T8131XA Disruption of external operation (surgical) wound, not elsewhere classified, initial encounter: Secondary | ICD-10-CM

## 2022-09-29 NOTE — Progress Notes (Signed)
Deanna [name] ([id number]) [id number] Page 1 of 5 Visit Report for 08/25/2022 HPI Details Patient Name: Date of Service: Deanna [name], [name] 08/25/2022 12:30 PM Medical Record Number: [id number] Patient Account Number: [id number] Date of Birth/Sex: Treating RN: 31-Dec-1970 (52 y.o. F) Primary Care Provider: Rodman [name] Other Clinician: Referring Provider: Treating Provider/Extender: Deanna [name] in Treatment: 0 History of Present Illness HPI Description: 08/21/2022 Ms. Deanna [name] is a 52 year old female with history of uterine fibroids that presents to the clinic for a 3-4-week history of nonhealing wound to the left heel. She had a lesion that was biopsied to this site by her dermatologist. Read was squamous cell carcinoma versus porocarcinoma. She subsequently had excision to remove the lesion On April 9th. She was subsequently treated with Unna boots and a foam dressing. 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 This was done initially out of fear that this was a squamous cell carcinoma. The patient says she has not had an enlarging area for 2 to 3 years. Her previous clinic visit was last week. We have been using Hydrofera Blue with Medihoney underneath. She has been wearing open heeled shoes to offload this Electronic Signature(s) Signed: 08/25/2022 3:53:26 PM By: Deanna Najjar MD Entered By: Deanna [name] on 08/25/2022 13:53:39 -------------------------------------------------------------------------------- Physical Exam Details Patient Name: Date of Service: Deanna [name] 08/25/2022 12:30 PM Medical Record Number: [id number] Patient Account Number: [id number] Date of Birth/Sex: Treating RN: Jun 01, 1970  (52 y.o. F) Primary Care Provider: Rodman [name] Other Clinician: Referring Provider: Treating Provider/Extender: Deanna [name] in Treatment: 0 Constitutional Sitting or standing Blood Pressure is within target range for patient.. Pulse regular and within target range for patient.Marland Kitchen Respirations regular, non-labored and within target range.. Temperature is normal and within the target range for the patient.Marland Kitchen Appears in no distress. Notes Wound exam; left heel there is an open wound here with some depth this may have improved slightly from last week although the length and width of the same. Thick edges of skin around the circumference of the wound. I vigorously wash this off with Vashe solution and gauze and got a generally healthy looking granulated appearance. There is no evidence of surrounding infection this does not probe to bone Electronic Signature(s) Signed: 08/25/2022 3:53:26 PM By: Deanna Najjar MD Entered By: Deanna [name] on 08/25/2022 13:54:47 -------------------------------------------------------------------------------- Physician Orders Details Patient Name: Date of Service: Deanna [name], Deanna [name] 08/25/2022 12:30 PM Medical Record Number: [id number] Patient Account Number: [id number] Date of Birth/Sex: Treating RN: 1970-07-24 (52 y.o. Gevena Mart Primary Care Provider: Rodman [name] Other Clinician: Referring Provider: Treating Provider/Extender: Deanna [name] in Treatment: 0 Verbal / Phone Orders: No Diagnosis Coding Follow-up Appointments ppointment in 1 week. - w/ Dr. Mikey [name] and Deanna [name] Rm # [address] Deanna [name] ([id number]) [id number] Page 2 of 5 Anesthetic (In clinic) Topical Lidocaine 5% applied to wound bed Cellular or Tissue Based Products Wound #1 Left Calcaneus Other Cellular or Tissue Based Products Orders/Instructions: - Run IVR for Theraskin. Bathing/ Shower/  Hygiene May shower with protection but do not get wound dressing(s) wet. Protect dressing(s) with water repellant cover (for example, large plastic bag) or a cast cover and may then take shower. Edema Control - Lymphedema /  SCD / Other Elevate legs to the level of the heart or above for 30 minutes daily and/or when sitting for 3-4 times a day throughout the day. Avoid standing for long periods of time. Off-Loading Other: - float heels when sitting/laying. Wound Treatment Wound #1 - Calcaneus Wound Laterality: Left Cleanser: Wound Cleanser (Generic) 1 x Per Day/15 Days Discharge Instructions: Cleanse the wound with wound cleanser prior to applying a clean dressing using gauze sponges, not tissue or cotton balls. Prim Dressing: Hydrofera Blue Ready Transfer Foam, 2.5x2.5 (in/in) (Generic) 1 x Per Day/15 Days ary Discharge Instructions: Apply directly to wound bed as directed Prim Dressing: MediHoney Gel, tube 1.5 (oz) 1 x Per Day/15 Days ary Discharge Instructions: Apply to wound bed as instructed Secondary Dressing: Woven Gauze Sponge, Non-Sterile 4x4 in (Generic) 1 x Per Day/15 Days Discharge Instructions: Apply over primary dressing as directed. Secondary Dressing: Zetuvit Plus Silicone Border Dressing 5x5 (in/in) (Generic) 1 x Per Day/15 Days Discharge Instructions: Apply silicone border over primary dressing as directed. Electronic Signature(s) Signed: 08/25/2022 3:53:26 PM By: Deanna Najjar MD Signed: 09/29/2022 7:49:05 AM By: Deanna [name] Entered By: Deanna [name] on 08/25/2022 13:39:06 Prescription 08/25/2022 -------------------------------------------------------------------------------- Deanna Housekeeper MD Patient Name: Provider: 03/21/1971 [id number] Date of Birth: NPI#: [id number] Sex: DEA #: [phone] Phone #: License #: [id number] UPN: Patient Address: [address] Eligha Bridegroom [facility] [place], [place] [address] [address] [place], [place] [address] [phone] Allergies No Known Allergies Provider's Orders Other Cellular or Tissue Based Products Orders/Instructions: - Run IVR for Theraskin. Hand Signature: Date(s): Electronic Signature(s) Signed: 08/25/2022 3:53:26 PM By: Deanna Najjar MD Signed: 09/29/2022 7:49:05 AM By: Deanna [name] Entered By: Deanna [name] on 08/25/2022 13:39:06 Deanna [name], Deanna [name] ([id number]) [id number] Page 3 of 5 -------------------------------------------------------------------------------- Problem List Details Patient Name: Date of Service: Deanna Deanna [name], Deanna [name] 08/25/2022 12:30 PM Medical Record Number: [id number] Patient Account Number: [id number] Date of Birth/Sex: Treating RN: [birth year] (52 y.o. Gevena Mart Primary Care Provider: Rodman [name] Other Clinician: Referring Provider: Treating Provider/Extender: Deanna [name] in Treatment: 0 Active Problems ICD-10 Encounter Code Description Active Date MDM Diagnosis S91.302A Unspecified open wound, left foot, initial encounter 08/21/2022 No Yes T81.31XA Disruption of external operation (surgical) wound, not elsewhere classified, 08/21/2022 No Yes initial encounter D23.9 Other benign neoplasm of skin, unspecified 08/21/2022 No Yes Inactive Problems Resolved Problems Electronic Signature(s) Signed: 08/25/2022 3:53:26 PM By: Deanna Najjar MD Entered By: Deanna [name] on 08/25/2022 13:51:02 -------------------------------------------------------------------------------- Progress Note Details Patient Name: Date of Service: Deanna [name] 08/25/2022 12:30 PM Medical Record Number: [id number] Patient Account Number: [id number] Date of Birth/Sex: Treating RN: [birth year] (52 y.o. F) Primary Care Provider: Rodman [name] Other Clinician: Referring Provider: Treating Provider/Extender: Deanna [name] in Treatment:  0 Subjective History of Present Illness (HPI) 08/21/2022 Ms. Gelsey Overmier is a 52 year old female with history of uterine fibroids that presents to the clinic for a 3-4-week history of nonhealing wound to the left heel. She had a lesion that was biopsied to this site by her dermatologist. Read was squamous cell carcinoma versus porocarcinoma. She subsequently had excision to remove the lesion On April 9th. She was subsequently treated with Unna boots and a foam dressing. 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 Currently patient denies signs of infecti 5/7; this is a patient who had Mohs surgery for a benign  tumor (Poroma) on her left heel just above the heel tip. This was done initially out of fear that this was a squamous cell carcinoma. The patient says she has not had an enlarging area for 2 to 3 years. Her previous clinic visit was last week. We have been using Hydrofera Blue with Medihoney underneath. She has been wearing open heeled shoes to offload this Objective Constitutional Sitting or standing Blood Pressure is within target range for patient.. Pulse regular and within target range for patient.Marland Kitchen Respirations regular, non-labored and within target range.. Temperature is normal and within the target range for the patient.Marland Kitchen Appears in no distress. Deanna [name], Deanna [name] ([id number]) [id number] Page 4 of 5 Vitals Time Taken: 1:00 PM, Temperature: 98.2 F, Pulse: 76 bpm, Respiratory Rate: 18 breaths/min, Blood Pressure: 118/79 mmHg. General Notes: Wound exam; left heel there is an open wound here with some depth this may have improved slightly from last week although the length and width of the same. Thick edges of skin around the circumference of the wound. I vigorously wash this off with Vashe solution and gauze and got a generally healthy looking granulated appearance. There is no evidence of  surrounding infection this does not probe to bone Integumentary (Hair, Skin) Wound #1 status is Open. Original cause of wound was Gradually Appeared. The date acquired was: 07/28/2022. The wound is located on the Left Calcaneus. The wound measures 1.5cm length x 2.4cm width x 0.2cm depth; 2.827cm^2 area and 0.565cm^3 volume. There is Fat Layer (Subcutaneous Tissue) exposed. There is a medium amount of serosanguineous drainage noted. The wound margin is distinct with the outline attached to the wound base. There is medium (34- 66%) red, pink granulation within the wound bed. There is a medium (34-66%) amount of necrotic tissue within the wound bed including Adherent Slough. The periwound skin appearance did not exhibit: Callus, Crepitus, Excoriation, Induration, Rash, Scarring, Dry/Scaly, Maceration, Atrophie Blanche, Cyanosis, Ecchymosis, Hemosiderin Staining, Mottled, Pallor, Rubor, Erythema. Periwound temperature was noted as No Abnormality. The periwound has tenderness on palpation. Assessment Active Problems ICD-10 Unspecified open wound, left foot, initial encounter Disruption of external operation (surgical) wound, not elsewhere classified, initial encounter Other benign neoplasm of skin, unspecified Plan Follow-up Appointments: Return Appointment in 1 week. - w/ Dr. Mikey [name] and Deanna [name] Rm # 8 Anesthetic: (In clinic) Topical Lidocaine 5% applied to wound bed Cellular or Tissue Based Products: Wound #1 Left Calcaneus: Other Cellular or Tissue Based Products Orders/Instructions: - Run IVR for Theraskin. Bathing/ Shower/ Hygiene: May shower with protection but do not get wound dressing(s) wet. Protect dressing(s) with water repellant cover (for example, large plastic bag) or a cast cover and may then take shower. Edema Control - Lymphedema / SCD / Other: Elevate legs to the level of the heart or above for 30 minutes daily and/or when sitting for 3-4 times a day throughout the day. Avoid  standing for long periods of time. Off-Loading: Other: - float heels when sitting/laying. WOUND #1: - Calcaneus Wound Laterality: Left Cleanser: Wound Cleanser (Generic) 1 x Per Day/15 Days Discharge Instructions: Cleanse the wound with wound cleanser prior to applying a clean dressing using gauze sponges, not tissue or cotton balls. Prim Dressing: Hydrofera Blue Ready Transfer Foam, 2.5x2.5 (in/in) (Generic) 1 x Per Day/15 Days ary Discharge Instructions: Apply directly to wound bed as directed Prim Dressing: MediHoney Gel, tube 1.5 (oz) 1 x Per Day/15 Days ary Discharge Instructions: Apply to wound bed as instructed Secondary Dressing: Woven Gauze Sponge, Non-Sterile 4x4 in (Generic)  1 x Per Day/15 Days Discharge Instructions: Apply over primary dressing as directed. Secondary Dressing: Zetuvit Plus Silicone Border Dressing 5x5 (in/in) (Generic) 1 x Per Day/15 Days Discharge Instructions: Apply silicone border over primary dressing as directed. 1. In view of the improvement of the surface of the wound I have continued the same dressing which is Medihoney and Hydrofera Blue 2. Looking at the surface of the wound in the condition I found myself wondering about advanced treatment products. 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 I am going to have the staff run TheraSkin through their insurance. None of the amniotic based products would likely be covered she is not a diabetic Electronic Signature(s) Signed: 08/25/2022 3:53:26 PM By: Deanna Najjar MD Entered By: Deanna [name] on 08/25/2022 13:56:03 -------------------------------------------------------------------------------- SuperBill Details Patient Name: Date of Service: Deanna [name] 08/25/2022 Medical Record Number: [id number] Patient Account Number: [id number] Deanna [name], Deanna [name] ([id number]) [id number] Page 5 of 5 Date of Birth/Sex: Treating RN: May 01, 1970 (52 y.o. Gevena Mart Primary Care Provider: Rodman [name] Other Clinician: Referring Provider: Treating Provider/Extender: Deanna [name] in Treatment: 0 Diagnosis Coding ICD-10 Codes Code Description [phone] Unspecified open wound, left foot, initial encounter T81.31XA Disruption of external operation (surgical) wound, not elsewhere classified, initial encounter D23.9 Other benign neoplasm of skin, unspecified Facility Procedures : CPT4 Code: [id number] Description: 99213 - WOUND CARE VISIT-LEV 3 EST PT Modifier: 25 Quantity: 1 Physician Procedures : CPT4 Code Description Modifier [id number] - WC PHYS LEVEL 3 - EST PT ICD-10 Diagnosis Description S91.302A Unspecified open wound, left foot, initial encounter T81.31XA Disruption of external operation (surgical) wound, not elsewhere classified,  initial encounter Quantity: 1 Electronic Signature(s) Signed: 08/25/2022 3:53:26 PM By: Deanna Najjar MD Entered By: Deanna [name] on 08/25/2022 13:56:33

## 2022-09-29 NOTE — Progress Notes (Signed)
Deanna, [name] ([id number]) [id number] Page 1 of 5 Visit Report for 09/28/2022 Chief Complaint Document Details Patient Name: Date of Service: [place] [name], Deanna 09/28/2022 3:00 PM Medical Record Number: [id number] Patient Account Number: [id number] Date of Birth/Sex: Treating RN: 11-Aug-1970 (52 y.o. F) Primary Care Provider: Rodman Pickle Other Clinician: Referring Provider: Treating Provider/Extender: York Grice in Treatment: 5 Information Obtained from: Patient Chief Complaint 08/21/2022; left heel wound Electronic Signature(s) Signed: 09/28/2022 4:48:17 PM By: Geralyn Corwin DO Entered By: Geralyn Corwin on 09/28/2022 15:56:39 -------------------------------------------------------------------------------- HPI Details Patient Name: Date of Service: [name] 09/28/2022 3:00 PM Medical Record Number: [id number] Patient Account Number: [id number] Date of Birth/Sex: Treating RN: 1970-05-06 (52 y.o. F) Primary Care Provider: Rodman Pickle Other Clinician: Referring Provider: Treating Provider/Extender: York Grice in Treatment: 5 History of Present Illness HPI Description: 08/21/2022 Ms. Deanna [name] is a 52 year old female with history of uterine fibroids that presents to the clinic for a 3-4-week history of nonhealing wound to the left heel. She had a lesion that was biopsied to this site by her dermatologist. Read was squamous cell carcinoma versus porocarcinoma. She subsequently had excision to remove the lesion On April 9th. She was subsequently treated with Unna boots and a foam dressing. 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 This was done initially out of  fear that this was a squamous cell carcinoma. The patient says she has not had an enlarging area for 2 to 3 years. Her previous clinic visit was last week. We have been using Hydrofera Blue with Medihoney underneath. She has been wearing open heeled shoes to offload this 5/16; patient presents for follow-up. We have been using Hydrofera Blue and Medihoney to the wound bed. The wound is smaller. Insurance denied her for TheraSkin. 5/23; patient presents for follow-up. We have been using Hydrofera Blue and Medihoney to the wound bed. This is smaller today. She has no issues or complaints. 5/30; patient presents for follow-up. Patient has been using Hydrofera Blue and Medihoney to the wound bed. Wound is smaller. 6/10; patient presents for follow-up. We have been using Medihoney and Hydrofera Blue to the wound bed. It is healed. Electronic Signature(s) Signed: 09/28/2022 4:48:17 PM By: Geralyn Corwin DO Entered By: Geralyn Corwin on 09/28/2022 15:57:06 -------------------------------------------------------------------------------- Physical Exam Details Patient Name: Date of Service: [place] [name], Deanna 09/28/2022 3:00 PM Medical Record Number: [id number] Patient Account Number: [id number] Date of Birth/Sex: Treating RN: 1970-05-23 (52 y.o. F) Primary Care Provider: Rodman Pickle Other Clinician: Referring Provider: Treating Provider/Extender: York Grice in Treatment: [address] OLUWATAMILORE, Deanna ([id number]) [id number] Page 2 of 5 Constitutional respirations regular, non-labored and within target range for patient.. Cardiovascular 2+ dorsalis pedis/posterior tibialis pulses. Psychiatric pleasant and cooperative. Notes T the left heel there is callus and epithelization to the previous wound site. No fluctuance on palpation. No drainage noted. o Electronic Signature(s) Signed: 09/28/2022 4:48:17 PM By: Geralyn Corwin DO Entered By:  Geralyn Corwin on 09/28/2022 15:57:34 -------------------------------------------------------------------------------- Physician Orders Details Patient Name: Date of Service: MA [name] 09/28/2022 3:00 PM Medical Record Number: [id number] Patient Account Number: [id number] Date of Birth/Sex: Treating RN: December 02, 1970 (52 y.o. Arta Silence Primary Care Provider: Rodman Pickle Other Clinician: Referring Provider:  Treating Provider/Extender: Blanchie Dessert Weeks in Treatment: 5 Verbal / Phone Orders: No Diagnosis Coding Follow-up Appointments ppointment in 2 weeks. - Dr. Mikey Bussing Monday 10/12/2022 215pm Return A Bathing/ Shower/ Hygiene May shower and wash wound with soap and water. Edema Control - Lymphedema / SCD / Other Elevate legs to the level of the heart or above for 30 minutes daily and/or when sitting for 3-4 times a day throughout the day. Avoid standing for long periods of time. Additional Orders / Instructions Other: - lotion to moisten the callous, hydrofera blue, and bandaid to closed area of left heel. Electronic Signature(s) Signed: 09/28/2022 4:48:17 PM By: Geralyn Corwin DO Entered By: Geralyn Corwin on 09/28/2022 15:58:25 -------------------------------------------------------------------------------- Problem List Details Patient Name: Date of Service: MA [name] 09/28/2022 3:00 PM Medical Record Number: [id number] Patient Account Number: [id number] Date of Birth/Sex: Treating RN: [birth year] (52 y.o. F) Primary Care Provider: Rodman Pickle Other Clinician: Referring Provider: Treating Provider/Extender: York Grice in Treatment: 5 Active Problems ICD-10 Encounter Code Description Active Date MDM Diagnosis S91.302A Unspecified open wound, left foot, initial encounter 08/21/2022 No Yes T81.31XA Disruption of external operation (surgical) wound, not elsewhere classified, 08/21/2022 No Yes initial  encounter [name], Deanna ([id number]) [id number] Page 3 of 5 D23.9 Other benign neoplasm of skin, unspecified 08/21/2022 No Yes Inactive Problems Resolved Problems Electronic Signature(s) Signed: 09/28/2022 4:48:17 PM By: Geralyn Corwin DO Entered By: Geralyn Corwin on 09/28/2022 15:56:27 -------------------------------------------------------------------------------- Progress Note Details Patient Name: Date of Service: MA [name]. 09/28/2022 3:00 PM Medical Record Number: [id number] Patient Account Number: [id number] Date of Birth/Sex: Treating RN: 1970/06/21 (52 y.o. F) Primary Care Provider: Rodman Pickle Other Clinician: Referring Provider: Treating Provider/Extender: York Grice in Treatment: 5 Subjective Chief Complaint Information obtained from Patient 08/21/2022; left heel wound History of Present Illness (HPI) 08/21/2022 Ms. Deanna [name] is a 52 year old female with history of uterine fibroids that presents to the clinic for a 3-4-week history of nonhealing wound to the left heel. She had a lesion that was biopsied to this site by her dermatologist. Read was squamous cell carcinoma versus porocarcinoma. She subsequently had excision to remove the lesion On April 9th. She was subsequently treated with Unna boots and a foam dressing. 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 This was done initially out of fear that this was a squamous cell carcinoma. The patient says she has not had an enlarging area for 2 to 3 years. Her previous clinic visit was last week. We have been using Hydrofera Blue with Medihoney underneath. She has been wearing open heeled shoes to offload this 5/16; patient  presents for follow-up. We have been using Hydrofera Blue and Medihoney to the wound bed. The wound is smaller. Insurance denied her for TheraSkin. 5/23; patient presents for follow-up. We have been using Hydrofera Blue and Medihoney to the wound bed. This is smaller today. She has no issues or complaints. 5/30; patient presents for follow-up. Patient has been using Hydrofera Blue and Medihoney to the wound bed. Wound is smaller. 6/10; patient presents for follow-up. We have been using Medihoney and Hydrofera Blue to the wound bed. It is healed. Patient History Information obtained from Patient, Chart. Family History Unknown History. Social History Never  smoker, Marital Status - Single, Alcohol Use - Never, Drug Use - No History, Caffeine Use - Rarely. Objective Constitutional respirations regular, non-labored and within target range for patient.. Vitals Time Taken: 3:21 PM, Pulse: 70 bpm, Respiratory Rate: 18 breaths/min, Blood Pressure: 126/87 mmHg. LAQUIDA, COTRELL ([id number]) [id number] Page 4 of 5 Cardiovascular 2+ dorsalis pedis/posterior tibialis pulses. Psychiatric pleasant and cooperative. General Notes: T the left heel there is callus and epithelization to the previous wound site. No fluctuance on palpation. No drainage noted. o Integumentary (Hair, Skin) Wound #1 status is Healed - Epithelialized. Original cause of wound was Gradually Appeared. The date acquired was: 07/28/2022. The wound has been in treatment 5 weeks. The wound is located on the Left Calcaneus. The wound measures 0cm length x 0cm width x 0cm depth; 0cm^2 area and 0cm^3 volume. There is Fat Layer (Subcutaneous Tissue) exposed. There is no tunneling or undermining noted. There is a medium amount of serosanguineous drainage noted. The wound margin is distinct with the outline attached to the wound base. There is large (67-100%) red granulation within the wound bed. There is no  necrotic tissue within the wound bed. The periwound skin appearance had no abnormalities noted for moisture. The periwound skin appearance had no abnormalities noted for color. The periwound skin appearance exhibited: Callus. The periwound skin appearance did not exhibit: Crepitus, Excoriation, Induration, Rash, Scarring. Periwound temperature was noted as No Abnormality. The periwound has tenderness on palpation. Assessment Active Problems ICD-10 Unspecified open wound, left foot, initial encounter Disruption of external operation (surgical) wound, not elsewhere classified, initial encounter Other benign neoplasm of skin, unspecified Patient has done well with Hydrofera Blue and Medihoney. Her wound is healed. She is concerned about it reopening and I recommended follow-up in 2 weeks. She can protect the area daily with a Band-Aid. Continue with offloading to this area with open heeled shoes. Plan Follow-up Appointments: Return Appointment in 2 weeks. - Dr. Mikey Bussing Monday 10/12/2022 215pm Bathing/ Shower/ Hygiene: May shower and wash wound with soap and water. Edema Control - Lymphedema / SCD / Other: Elevate legs to the level of the heart or above for 30 minutes daily and/or when sitting for 3-4 times a day throughout the day. Avoid standing for long periods of time. Additional Orders / Instructions: Other: - lotion to moisten the callous, hydrofera blue, and bandaid to closed area of left heel. 1. Follow-up in 2 weeks Electronic Signature(s) Signed: 09/28/2022 4:48:17 PM By: Geralyn Corwin DO Entered By: Geralyn Corwin on 09/28/2022 16:00:17 -------------------------------------------------------------------------------- HxROS Details Patient Name: Date of Service: MA [name]. 09/28/2022 3:00 PM Medical Record Number: [id number] Patient Account Number: [id number] Date of Birth/Sex: Treating RN: 06-22-1970 (52 y.o. F) Primary Care Provider: Rodman Pickle Other  Clinician: Referring Provider: Treating Provider/Extender: York Grice in Treatment: 5 Information Obtained From Patient Chart Immunizations Pneumococcal Vaccine: Received Pneumococcal Vaccination: No Implantable Devices SHIA, DELAINE ([id number]) [id number] Page 5 of 5 None Family and Social History Unknown History: Yes; Never smoker; Marital Status - Single; Alcohol Use: Never; Drug Use: No History; Caffeine Use: Rarely; Financial Concerns: No; Food, Clothing or Shelter Needs: No; Support System Lacking: No; Transportation Concerns: No Electronic Signature(s) Signed: 09/28/2022 4:48:17 PM By: Geralyn Corwin DO Entered By: Geralyn Corwin on 09/28/2022 15:57:10 -------------------------------------------------------------------------------- SuperBill Details Patient Name: Date of Service: MA [name] 09/28/2022 Medical Record Number: [id number] Patient Account Number: [id number] Date of Birth/Sex: Treating RN: Oct 01, 1970 (52 y.o. Arta Silence Primary Care Provider: Rodman Pickle  Other Clinician: Referring Provider: Treating Provider/Extender: Claris Gladden, Lauren Weeks in Treatment: 5 Diagnosis Coding ICD-10 Codes Code Description [phone] Unspecified open wound, left foot, initial encounter T81.31XA Disruption of external operation (surgical) wound, not elsewhere classified, initial encounter D23.9 Other benign neoplasm of skin, unspecified Facility Procedures : 7 CPT4 Code: [id number] Description: 99213 - WOUND CARE VISIT-LEV 3 EST PT Modifier: Quantity: 1 Physician Procedures : CPT4 Code Description Modifier [id number] 99213 - WC PHYS LEVEL 3 - EST PT ICD-10 Diagnosis Description S91.302A Unspecified open wound, left foot, initial encounter T81.31XA Disruption of external operation (surgical) wound, not elsewhere classified,  initial encounter D23.9 Other benign neoplasm of skin,  unspecified Quantity: 1 Electronic Signature(s) Signed: 09/28/2022 4:48:17 PM By: Geralyn Corwin DO Entered By: Geralyn Corwin on 09/28/2022 16:00:29

## 2022-09-29 NOTE — Progress Notes (Signed)
MINORI, FLEES ([id number]) [id number] Page 1 of 8 Visit Report for 08/25/2022 Arrival Information Details Patient Name: Date of Service: Deanna [name] Deanna [name], Deanna [name] 08/25/2022 12:30 PM Medical Record Number: [id number] Patient Account Number: [id number] Date of Birth/Sex: Treating RN: 1970-12-06 (52 y.o. Deanna [name] Deanna [name]: Deanna [name] Other Clinician: Referring Deanna [name]: Treating Deanna [name]/Extender: Deanna [name] in Treatment: 0 Visit Information History Since Last Visit All ordered tests and consults were completed: Yes Patient Arrived: Ambulatory Added or deleted any medications: No Arrival Time: 13:01 Any new allergies or adverse reactions: No Accompanied By: self Had a fall or experienced change in No Transfer Assistance: None activities of daily living that may affect Patient Identification Verified: Yes risk of falls: Secondary Verification Process Completed: Yes Signs or symptoms of abuse/neglect since last visito No Patient Requires Transmission-Based Precautions: No Hospitalized since last visit: No Patient Has Alerts: No Implantable device outside of the clinic excluding No cellular tissue based products placed in the center since last visit: Has Dressing in Place as Prescribed: Yes Pain Present Now: No Electronic Signature(s) Signed: 09/29/2022 7:49:05 AM By: Deanna [name] Entered By: Deanna [name] on 08/25/2022 13:04:51 -------------------------------------------------------------------------------- Clinic Level of [name] Assessment Details Patient Name: Date of Service: Deanna [name], Deanna [name] 08/25/2022 12:30 PM Medical Record Number: [id number] Patient Account Number: [id number] Date of Birth/Sex: Treating RN: 09-May-1970 (52 y.o. Deanna [name] Deanna [name]: Deanna [name] Other Clinician: Referring Deanna [name]: Treating Deanna [name]/Extender: Deanna [name] in Treatment:  0 Clinic Level of [name] Assessment Items TOOL 1 Quantity Score X- 1 0 Use when EandM and Procedure is performed on INITIAL visit ASSESSMENTS - Nursing Assessment / Reassessment X- 1 20 General Physical Exam (combine w/ comprehensive assessment (listed just below) when performed on new pt. evals) X- 1 25 Comprehensive Assessment (HX, ROS, Risk Assessments, Wounds Hx, etc.) ASSESSMENTS - Wound and Skin Assessment / Reassessment X- 1 10 Dermatologic / Skin Assessment (not related to wound area) ASSESSMENTS - Ostomy and/or Continence Assessment and [name] []  - 0 Incontinence Assessment and Management []  - 0 Ostomy [name] Assessment and Management (repouching, etc.) PROCESS - Coordination of [name] X - Simple Patient / Family Education for ongoing [name] 1 15 []  - 0 Complex (extensive) Patient / Family Education for ongoing [name] X- 1 10 Staff obtains Consents, Records, T Results / Process Orders est []  - 0 Staff telephones HHA, Nursing Homes / Clarify orders / etc []  - 0 Routine Transfer to another Facility (non-emergent condition) []  - 0 Routine Hospital Admission (non-emergent condition) [name], [name] ([id number]) [id number] Page 2 of 8 []  - 0 New Admissions / Manufacturing engineer / Ordering NPWT Apligraf, etc. , []  - 0 Emergency Hospital Admission (emergent condition) PROCESS - Special Needs []  - 0 Pediatric / Minor Patient Management []  - 0 Isolation Patient Management []  - 0 Hearing / Language / Visual special needs []  - 0 Assessment of Community assistance (transportation, D/C planning, etc.) []  - 0 Additional assistance / Altered mentation []  - 0 Support Surface(s) Assessment (bed, cushion, seat, etc.) INTERVENTIONS - Miscellaneous []  - 0 External ear exam []  - 0 Patient Transfer (multiple staff / Nurse, adult / Similar devices) X- 1 5 Simple Staple / Suture removal (25 or less) []  - 0 Complex Staple / Suture removal (26 or more) []  -  0 Hypo/Hyperglycemic Management (do not check if billed separately) []  - 0 Ankle / Brachial Index (ABI) - do not check if billed separately Has the patient been  seen at the hospital within the last three years: Yes Total Score: 85 Level Of [name]: New/Established - Level 3 Electronic Signature(s) Signed: 09/29/2022 7:49:05 AM By: Deanna [name] Entered By: Deanna [name] on 08/25/2022 13:40:21 -------------------------------------------------------------------------------- Encounter Discharge Information Details Patient Name: Date of Service: Deanna [name] 08/25/2022 12:30 PM Medical Record Number: [id number] Patient Account Number: [id number] Date of Birth/Sex: Treating RN: September 20, 1970 (52 y.o. Deanna [name] Deanna [name]: Deanna [name] Other Clinician: Referring Deanna [name]: Treating Deanna [name] in Treatment: 0 Encounter Discharge Information Items Discharge Condition: Stable Ambulatory Status: Ambulatory Discharge Destination: Home Transportation: Private Auto Accompanied By: self Schedule Follow-up Appointment: Yes Clinical Summary of [name]: Patient Declined Electronic Signature(s) Signed: 09/29/2022 7:49:05 AM By: Deanna [name] Entered By: Deanna [name] on 08/25/2022 13:48:15 -------------------------------------------------------------------------------- Lower Extremity Assessment Details Patient Name: Date of Service: Deanna [name], Deanna [name] 08/25/2022 12:30 PM Medical Record Number: [id number] Patient Account Number: [id number] Date of Birth/Sex: Treating RN: [birth year] (52 y.o. Deanna [name] Deanna [name]: Deanna [name] Other Clinician: Referring Deanna [name]: Treating Deanna [name] in Treatment: 0 Edema Assessment M[Left: Deanna [name] ([id number])] Franne Forts: [id number] Page 3 of 8] Assessed: [Left: No] [Right: No] Edema: [Left: N] [Right:  o] Calf Left: Right: Point of Measurement: From Medial Instep 34.5 cm Ankle Left: Right: Point of Measurement: From Medial Instep 21 cm Vascular Assessment Pulses: Dorsalis Pedis Palpable: [Left:Yes] Electronic Signature(s) Signed: 09/29/2022 7:49:05 AM By: Deanna [name] Entered By: Deanna [name] on 08/25/2022 13:10:40 -------------------------------------------------------------------------------- Multi Wound Chart Details Patient Name: Date of Service: Deanna [name] 08/25/2022 12:30 PM Medical Record Number: [id number] Patient Account Number: [id number] Date of Birth/Sex: Treating RN: 1970/08/03 (52 y.o. F) Primary [name] Damaso Laday: Deanna [name] Other Clinician: Referring Mintie Witherington: Treating Manjinder Breau/Extender: Deanna [name] in Treatment: 0 Vital Signs Height(in): Pulse(bpm): 76 Weight(lbs): Blood Pressure(mmHg): 118/79 Body Mass Index(BMI): Temperature(F): 98.2 Respiratory Rate(breaths/min): 18 [1:Photos:] [N/A:N/A] Left Calcaneus N/A N/A Wound Location: Gradually Appeared N/A N/A Wounding Event: Open Surgical Wound N/A N/A Primary Etiology: 07/28/2022 N/A N/A Date Acquired: 0 N/A N/A Weeks of Treatment: Open N/A N/A Wound Status: No N/A N/A Wound Recurrence: 1.5x2.4x0.2 N/A N/A Measurements L x W x D (cm) 2.827 N/A N/A A (cm) : rea 0.565 N/A N/A Volume (cm) : 0.00% N/A N/A % Reduction in A rea: 33.40% N/A N/A % Reduction in Volume: Full Thickness With Exposed Support N/A N/A Classification: Structures Medium N/A N/A Exudate Amount: Serosanguineous N/A N/A Exudate Type: red, brown N/A N/A Exudate Color: Distinct, outline attached N/A N/A Wound Margin: Medium (34-66%) N/A N/A Granulation Amount: Red, Pink N/A N/A Granulation Quality: Medium (34-66%) N/A N/A Necrotic Amount: Fat Layer (Subcutaneous Tissue): Yes N/A N/A Exposed Structures: Fascia: No Tendon: No Deanna [name], Deanna [name] ([id number])  [id number] Page 4 of 8 Muscle: No Joint: No Bone: No Excoriation: No N/A N/A Periwound Skin Texture: Induration: No Callus: No Crepitus: No Rash: No Scarring: No Maceration: No N/A N/A Periwound Skin Moisture: Dry/Scaly: No Atrophie Blanche: No N/A N/A Periwound Skin Color: Cyanosis: No Ecchymosis: No Erythema: No Hemosiderin Staining: No Mottled: No Pallor: No Rubor: No No Abnormality N/A N/A Temperature: Yes N/A N/A Tenderness on Palpation: Treatment Notes Wound #1 (Calcaneus) Wound Laterality: Left Cleanser Wound Cleanser Discharge Instruction: Cleanse the wound with wound cleanser prior to applying a clean dressing using gauze sponges, not tissue or cotton balls. Peri-Wound [name] Topical Primary Dressing Hydrofera Blue Ready Transfer Foam, 2.5x2.5 (in/in) Discharge Instruction: Apply directly to wound  bed as directed MediHoney Gel, tube 1.5 (oz) Discharge Instruction: Apply to wound bed as instructed Secondary Dressing Woven Gauze Sponge, Non-Sterile 4x4 in Discharge Instruction: Apply over primary dressing as directed. Zetuvit Plus Silicone Border Dressing 5x5 (in/in) Discharge Instruction: Apply silicone border over primary dressing as directed. Secured With Compression Wrap Compression Stockings Facilities manager) Signed: 08/25/2022 3:53:26 PM By: Baltazar Najjar MD Entered By: Baltazar Najjar on 08/25/2022 13:51:12 -------------------------------------------------------------------------------- Multi-Disciplinary [name] Plan Details Patient Name: Date of Service: Deanna [name] 08/25/2022 12:30 PM Medical Record Number: [id number] Patient Account Number: [id number] Date of Birth/Sex: Treating RN: 1970-09-01 (52 y.o. Deanna [name] Keonna Raether: Deanna [name] Other Clinician: Referring Treazure Nery: Treating Marthe Dant/Extender: Deanna [name] in Treatment: 0 Active  Inactive Orientation to the Wound [name] Program Nursing Diagnoses: CYDNEE, BORTH ([id number]) [phone].pdf Page 5 of 8 Knowledge deficit related to the wound healing center program Goals: Patient/caregiver will verbalize understanding of the Wound Healing Center Program Date Initiated: 08/21/2022 Target Resolution Date: 09/25/2022 Goal Status: Active Interventions: Provide education on orientation to the wound center Notes: Wound/Skin Impairment Nursing Diagnoses: Impaired tissue integrity Knowledge deficit related to ulceration/compromised skin integrity Goals: Patient will have a decrease in wound volume by X% from date: (specify in notes) Date Initiated: 08/21/2022 Target Resolution Date: 09/26/2022 Goal Status: Active Patient/caregiver will verbalize understanding of skin [name] regimen Date Initiated: 08/21/2022 Target Resolution Date: 09/24/2022 Goal Status: Active Ulcer/skin breakdown will have a volume reduction of 30% by week 4 Date Initiated: 08/21/2022 Target Resolution Date: 09/24/2022 Goal Status: Active Ulcer/skin breakdown will have a volume reduction of 50% by week 8 Date Initiated: 08/21/2022 Target Resolution Date: 09/24/2022 Goal Status: Active Interventions: Assess patient/caregiver ability to obtain necessary supplies Assess patient/caregiver ability to perform ulcer/skin [name] regimen upon admission and as needed Assess ulceration(s) every visit Notes: Electronic Signature(s) Signed: 09/29/2022 7:49:05 AM By: Deanna [name] Entered By: Deanna [name] on 08/25/2022 13:17:48 -------------------------------------------------------------------------------- Pain Assessment Details Patient Name: Date of Service: Deanna [name] 08/25/2022 12:30 PM Medical Record Number: [id number] Patient Account Number: [id number] Date of Birth/Sex: Treating RN: 1971/01/02 (52 y.o. Deanna [name] Zakaiya Lares: Deanna [name] Other Clinician: Referring  Halana Deisher: Treating Sani Madariaga/Extender: Deanna [name] in Treatment: 0 Active Problems Location of Pain Severity and Description of Pain Patient Has Paino No Site Locations [employer], [place] ([id number]) [phone].pdf Page 6 of 8 Pain Management and Medication Current Pain Management: Electronic Signature(s) Signed: 09/29/2022 7:49:05 AM By: Deanna [name] Entered By: Deanna [name] on 08/25/2022 13:08:17 -------------------------------------------------------------------------------- Patient/Caregiver Education Details Patient Name: Date of Service: Deanna [name] 5/7/2024andnbsp12:30 PM Medical Record Number: [id number] Patient Account Number: [id number] Date of Birth/Gender: Treating RN: [birth year] (52 y.o. Deanna [name] Physician: Deanna [name] Other Clinician: Referring Physician: Treating Physician/Extender: Deanna [name] in Treatment: 0 Education Assessment Education Provided To: Patient Education Topics Provided Wound/Skin Impairment: Methods: Explain/Verbal Responses: State content correctly Electronic Signature(s) Signed: 09/29/2022 7:49:05 AM By: Deanna [name] Entered By: Deanna [name] on 08/25/2022 13:18:16 -------------------------------------------------------------------------------- Wound Assessment Details Patient Name: Date of Service: Deanna [name] 08/25/2022 12:30 PM Medical Record Number: [id number] Patient Account Number: [id number] Date of Birth/Sex: Treating RN: 1970/12/19 (52 y.o. Deanna [name] Morayma Godown: Deanna [name] Other Clinician: Referring Aimy Sweeting: Treating Prim Morace/Extender: Deanna [name] in Treatment: 0 Wound Status Wound Number: 1 Primary Etiology: Open Surgical Wound Wound Location: Left Calcaneus Wound Status: Open Wounding Event: Gradually Appeared Date Acquired: 07/28/2022 Weeks  Of Treatment:  0 Clustered Wound: No Deanna [name], Deanna [name] ([id number]) [phone].pdf Page 7 of 8 Photos Wound Measurements Length: (cm) 1.5 Width: (cm) 2.4 Depth: (cm) 0.2 Area: (cm) 2.827 Volume: (cm) 0.565 % Reduction in Area: 0% % Reduction in Volume: 33.4% Wound Description Classification: Full Thickness With Exposed Suppo Wound Margin: Distinct, outline attached Exudate Amount: Medium Exudate Type: Serosanguineous Exudate Color: red, brown rt Structures Foul Odor After Cleansing: No Slough/Fibrino Yes Wound Bed Granulation Amount: Medium (34-66%) Exposed Structure Granulation Quality: Red, Pink Fascia Exposed: No Necrotic Amount: Medium (34-66%) Fat Layer (Subcutaneous Tissue) Exposed: Yes Necrotic Quality: Adherent Slough Tendon Exposed: No Muscle Exposed: No Joint Exposed: No Bone Exposed: No Periwound Skin Texture Texture Color No Abnormalities Noted: No No Abnormalities Noted: No Callus: No Atrophie Blanche: No Crepitus: No Cyanosis: No Excoriation: No Ecchymosis: No Induration: No Erythema: No Rash: No Hemosiderin Staining: No Scarring: No Mottled: No Pallor: No Moisture Rubor: No No Abnormalities Noted: No Dry / Scaly: No Temperature / Pain Maceration: No Temperature: No Abnormality Tenderness on Palpation: Yes Electronic Signature(s) Signed: 09/29/2022 7:49:05 AM By: Deanna [name] Entered By: Deanna [name] on 08/25/2022 13:14:22 -------------------------------------------------------------------------------- Vitals Details Patient Name: Date of Service: Deanna [name] 08/25/2022 12:30 PM Medical Record Number: [id number] Patient Account Number: [id number] Date of Birth/Sex: Treating RN: [birth year] (52 y.o. Deanna [name] Shawna Kiener: Deanna [name] Other Clinician: Referring Yamileth Hayse: Treating Valentin Benney/Extender: Deanna [name] in Treatment: 0 Vital Signs Time Taken: 13:00 Temperature (F):  98.2 Pulse (bpm): [address] ([id number]) [phone].pdf Page 8 of 8 Respiratory Rate (breaths/min): 18 Blood Pressure (mmHg): 118/79 Reference Range: 80 - 120 mg / dl Electronic Signature(s) Signed: 09/29/2022 7:49:05 AM By: Deanna [name] Entered By: Deanna [name] on 08/25/2022 13:08:02

## 2022-10-12 ENCOUNTER — Encounter (HOSPITAL_BASED_OUTPATIENT_CLINIC_OR_DEPARTMENT_OTHER): Payer: No Typology Code available for payment source | Admitting: Internal Medicine

## 2022-10-12 DIAGNOSIS — S91302A Unspecified open wound, left foot, initial encounter: Secondary | ICD-10-CM | POA: Diagnosis not present

## 2022-10-12 DIAGNOSIS — T8131XA Disruption of external operation (surgical) wound, not elsewhere classified, initial encounter: Secondary | ICD-10-CM

## 2022-10-12 DIAGNOSIS — D239 Other benign neoplasm of skin, unspecified: Secondary | ICD-10-CM | POA: Diagnosis not present

## 2022-10-12 DIAGNOSIS — T8189XA Other complications of procedures, not elsewhere classified, initial encounter: Secondary | ICD-10-CM | POA: Diagnosis not present

## 2022-10-12 NOTE — Progress Notes (Signed)
CAOILAINN, SACKS ([id number]) [id number] Page 1 of 6 Visit Report for 10/12/2022 Chief Complaint Document Details Patient Name: Date of Service: [place] Deanna, [name] 10/12/2022 2:15 PM Medical Record Number: [id number] Patient Account Number: [id number] Date of Birth/Sex: Treating RN: [birth year] (52 y.o. F) Primary Care Provider: Rodman Pickle Other Clinician: Referring Provider: Treating Provider/Extender: York Grice in Treatment: 7 Information Obtained from: Patient Chief Complaint 08/21/2022; left heel wound Electronic Signature(s) Signed: 10/12/2022 4:29:34 PM By: Geralyn Corwin DO Entered By: Geralyn Corwin on 10/12/2022 15:10:16 -------------------------------------------------------------------------------- HPI Details Patient Name: Date of Service: [name] 10/12/2022 2:15 PM Medical Record Number: [id number] Patient Account Number: [id number] Date of Birth/Sex: Treating RN: 19-Apr-1971 (52 y.o. F) Primary Care Provider: Rodman Pickle Other Clinician: Referring Provider: Treating Provider/Extender: York Grice in Treatment: 7 History of Present Illness HPI Description: 08/21/2022 Ms. Deanna [name] is a 52 year old female with history of uterine fibroids that presents to the clinic for a 3-4-week history of nonhealing wound to the left heel. She had a lesion that was biopsied to this site by her dermatologist. Read was squamous cell carcinoma versus porocarcinoma. She subsequently had excision to remove the lesion On April 9th. She was subsequently treated with Unna boots and a foam dressing. 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 This was done initially out of  fear that this was a squamous cell carcinoma. The patient says she has not had an enlarging area for 2 to 3 years. Her previous clinic visit was last week. We have been using Hydrofera Blue with Medihoney underneath. She has been wearing open heeled shoes to offload this 5/16; patient presents for follow-up. We have been using Hydrofera Blue and Medihoney to the wound bed. The wound is smaller. Insurance denied her for TheraSkin. 5/23; patient presents for follow-up. We have been using Hydrofera Blue and Medihoney to the wound bed. This is smaller today. She has no issues or complaints. 5/30; patient presents for follow-up. Patient has been using Hydrofera Blue and Medihoney to the wound bed. Wound is smaller. 6/10; patient presents for follow-up. We have been using Medihoney and Hydrofera Blue to the wound bed. It is healed. 6/24; patient presents for follow-up. She is out of town for 2 weeks and reports no reopening of the wound and no drainage noted. Electronic Signature(s) Signed: 10/12/2022 4:29:34 PM By: Geralyn Corwin DO Entered By: Geralyn Corwin on 10/12/2022 15:10:39 Annette Stable ([id number]) [id number] Page 2 of 6 -------------------------------------------------------------------------------- Physical Exam Details Patient Name: Date of Service: [place] Deanna, [name] 10/12/2022 2:15 PM Medical Record Number: [id number] Patient Account Number: [id number] Date of Birth/Sex: Treating RN: Apr 25, 1970 (52 y.o. F) Primary Care Provider: Rodman Pickle Other Clinician: Referring Provider: Treating Provider/Extender: Claris Gladden, Lauren Weeks in Treatment: 7 Constitutional respirations regular, non-labored and within target range for patient.. Cardiovascular 2+ dorsalis pedis/posterior tibialis pulses. Psychiatric pleasant and cooperative. Notes T the left heel there is callus and epithelization to the previous wound site. No fluctuance on  palpation. No drainage noted. o Electronic Signature(s) Signed: 10/12/2022 4:29:34 PM By: Geralyn Corwin DO Entered By: Geralyn Corwin on 10/12/2022 15:11:05 -------------------------------------------------------------------------------- Physician Orders Details Patient Name: Date of Service: [name] 10/12/2022 2:15 PM Medical Record Number: [id number]  Patient Account Number: [id number] Date of Birth/Sex: Treating RN: 06-May-1970 (52 y.o. Arta Silence Primary Care Provider: Rodman Pickle Other Clinician: Referring Provider: Treating Provider/Extender: York Grice in Treatment: 7 Verbal / Phone Orders: No Diagnosis Coding ICD-10 Coding Code Description [phone] Unspecified open wound, left foot, initial encounter T81.31XA Disruption of external operation (surgical) wound, not elsewhere classified, initial encounter D23.9 Other benign neoplasm of skin, unspecified Discharge From [facility] Services Discharge from Wound Care Center - Lotion daily foot and closed area. wear show with open backs from another week. Call if any future wound care needs. Electronic Signature(s) Signed: 10/12/2022 4:29:34 PM By: Geralyn Corwin DO Entered By: Geralyn Corwin on 10/12/2022 15:11:34 Annette Stable ([id number]) [id number] Page 3 of 6 -------------------------------------------------------------------------------- Problem List Details Patient Name: Date of Service: [place] Deanna, [name] 10/12/2022 2:15 PM Medical Record Number: [id number] Patient Account Number: [id number] Date of Birth/Sex: Treating RN: 1971/04/05 (52 y.o. Deanna [name], Deanna [name] Primary Care Provider: Rodman Pickle Other Clinician: Referring Provider: Treating Provider/Extender: York Grice in Treatment: 7 Active Problems ICD-10 Encounter Code Description Active Date MDM Diagnosis S91.302A Unspecified open wound, left foot, initial  encounter 08/21/2022 No Yes T81.31XA Disruption of external operation (surgical) wound, not elsewhere classified, 08/21/2022 No Yes initial encounter D23.9 Other benign neoplasm of skin, unspecified 08/21/2022 No Yes Inactive Problems Resolved Problems Electronic Signature(s) Signed: 10/12/2022 4:29:34 PM By: Geralyn Corwin DO Entered By: Geralyn Corwin on 10/12/2022 15:10:06 -------------------------------------------------------------------------------- Progress Note Details Patient Name: Date of Service: [name] 10/12/2022 2:15 PM Medical Record Number: [id number] Patient Account Number: [id number] Date of Birth/Sex: Treating RN: Aug 07, 1970 (52 y.o. F) Primary Care Provider: Rodman Pickle Other Clinician: Referring Provider: Treating Provider/Extender: York Grice in Treatment: 7 Subjective Chief Complaint Information obtained from Patient 08/21/2022; left heel wound History of Present Illness (HPI) 08/21/2022 Ms. Deanna [name] is a 52 year old female with history of uterine fibroids that presents to the clinic for a 3-4-week history of nonhealing wound to the left heel. She had a lesion that was biopsied to this site by her dermatologist. Read was squamous cell carcinoma versus porocarcinoma. She subsequently had excision to remove the lesion On April 9th. She was subsequently treated with Unna boots and a foam dressing. 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 This was done initially out of fear that this was a squamous cell carcinoma. The patient says she has not had an enlarging area for 2 to 3 years. Her previous clinic visit was last week. We have been using Hydrofera Blue with Medihoney underneath. She has been wearing  open heeled shoes to offload this Deanna [name], Deanna [name] ([id number]) [id number].pdf Page 4 of 6 5/16; patient presents for follow-up. We have been using Hydrofera Blue and Medihoney to the wound bed. The wound is smaller. Insurance denied her for TheraSkin. 5/23; patient presents for follow-up. We have been using Hydrofera Blue and Medihoney to the wound bed. This is smaller today. She has no issues or complaints. 5/30; patient presents for follow-up. Patient has been using Hydrofera Blue and Medihoney to the wound bed. Wound is smaller. 6/10; patient presents for follow-up. We have been using Medihoney and Hydrofera Blue to the wound bed. It is healed. 6/24; patient presents for  follow-up. She is out of town for 2 weeks and reports no reopening of the wound and no drainage noted. Patient History Information obtained from Patient, Chart. Family History Unknown History. Social History Never smoker, Marital Status - Single, Alcohol Use - Never, Drug Use - No History, Caffeine Use - Rarely. Objective Constitutional respirations regular, non-labored and within target range for patient.. Vitals Time Taken: 2:25 PM, Temperature: 98.1 F, Pulse: 70 bpm, Respiratory Rate: 18 breaths/min, Blood Pressure: 144/89 mmHg. Cardiovascular 2+ dorsalis pedis/posterior tibialis pulses. Psychiatric pleasant and cooperative. General Notes: T the left heel there is callus and epithelization to the previous wound site. No fluctuance on palpation. No drainage noted. o Assessment Active Problems ICD-10 Unspecified open wound, left foot, initial encounter Disruption of external operation (surgical) wound, not elsewhere classified, initial encounter Other benign neoplasm of skin, unspecified Patient's wound remains closed. I recommended lotion every night to the area as this has callus. She knows to call with any questions or concerns. Follow-up as needed. Plan Discharge From [facility]  Services: Discharge from Wound Care Center - Lotion daily foot and closed area. wear show with open backs from another week. Call if any future wound care needs. 1. Discharge from clinic due to closed wound 2. Follow-up as needed Electronic Signature(s) Signed: 10/12/2022 4:29:34 PM By: Geralyn Corwin DO Entered By: Geralyn Corwin on 10/12/2022 15:12:55 Annette Stable ([id number]) [id number] Page 5 of 6 -------------------------------------------------------------------------------- HxROS Details Patient Name: Date of Service: [place] Deanna, [name] 10/12/2022 2:15 PM Medical Record Number: [id number] Patient Account Number: [id number] Date of Birth/Sex: Treating RN: 1971/02/03 (52 y.o. F) Primary Care Provider: Rodman Pickle Other Clinician: Referring Provider: Treating Provider/Extender: York Grice in Treatment: 7 Information Obtained From Patient Chart Immunizations Pneumococcal Vaccine: Received Pneumococcal Vaccination: No Implantable Devices None Family and Social History Unknown History: Yes; Never smoker; Marital Status - Single; Alcohol Use: Never; Drug Use: No History; Caffeine Use: Rarely; Financial Concerns: No; Food, Clothing or Shelter Needs: No; Support System Lacking: No; Transportation Concerns: No Electronic Signature(s) Signed: 10/12/2022 4:29:34 PM By: Geralyn Corwin DO Entered By: Geralyn Corwin on 10/12/2022 15:10:43 -------------------------------------------------------------------------------- SuperBill Details Patient Name: Date of Service: [name] 10/12/2022 Medical Record Number: [id number] Patient Account Number: [id number] Date of Birth/Sex: Treating RN: 04-28-1970 (52 y.o. Arta Silence Primary Care Provider: Rodman Pickle Other Clinician: Referring Provider: Treating Provider/Extender: York Grice in Treatment: 7 Diagnosis Coding ICD-10  Codes Code Description [phone] Unspecified open wound, left foot, initial encounter T81.31XA Disruption of external operation (surgical) wound, not elsewhere classified, initial encounter D23.9 Other benign neoplasm of skin, unspecified Facility Procedures : 7 CPT4 Code: 1660630 Description: 99213 - WOUND CARE VISIT-LEV 3 EST PT Modifier: Quantity: 1 Physician Procedures : CPT4 Code Description Modifier [id number] 99213 - WC PHYS LEVEL 3 - EST PT ICD-10 Diagnosis Description S91.302A Unspecified open wound, left foot, initial encounter T81.31XA Disruption of external operation (surgical) wound, not elsewhere classified,  initial encounter Deanna, [name] ([id number]) [id number] Pag D23.9 Other benign neoplasm of skin, unspecified Quantity: 1 e 6 of 6 Electronic Signature(s) Signed: 10/12/2022 4:29:34 PM By: Geralyn Corwin DO Entered By: Geralyn Corwin on 10/12/2022 15:13:05

## 2022-10-16 NOTE — Progress Notes (Signed)
Deanna [name], Deanna [name] ([id number]) [id number] Page 1 of 6 Visit Report for 10/12/2022 Arrival Information Details Patient Name: Date of Service: Deanna GINAMARIE, ALLBEE 10/12/2022 2:15 PM Medical Record Number: [id number] Patient Account Number: [id number] Date of Birth/Sex: Treating RN: Nov 13, 1970 (52 y.o. F) Primary Care Brizeida Mcmurry: Rodman Pickle Other Clinician: Referring Allin Frix: Treating Anish Vana/Extender: York Grice in Treatment: 7 Visit Information History Since Last Visit Added or deleted any medications: No Patient Arrived: Ambulatory Any new allergies or adverse reactions: No Arrival Time: 14:19 Had a fall or experienced change in No Accompanied By: self activities of daily living that may affect Transfer Assistance: None risk of falls: Patient Identification Verified: Yes Signs or symptoms of abuse/neglect since last visito No Secondary Verification Process Completed: Yes Hospitalized since last visit: No Patient Requires Transmission-Based Precautions: No Implantable device outside of the clinic excluding No Patient Has Alerts: No cellular tissue based products placed in the center since last visit: Has Dressing in Place as Prescribed: Yes Pain Present Now: No Electronic Signature(s) Signed: 10/16/2022 11:18:15 AM By: Thayer Dallas Entered By: Thayer Dallas on 10/12/2022 14:21:27 -------------------------------------------------------------------------------- Clinic Level of Care Assessment Details Patient Name: Date of Service: Deanna [name], Deanna [name] 10/12/2022 2:15 PM Medical Record Number: [id number] Patient Account Number: [id number] Date of Birth/Sex: Treating RN: 03-Nov-1970 (52 y.o. Arta Silence Primary Care Castin Donaghue: Rodman Pickle Other Clinician: Referring Engelbert Sevin: Treating Severus Brodzinski/Extender: York Grice in Treatment: 7 Clinic Level of Care Assessment Items TOOL 4 Quantity Score X-  1 0 Use when only an EandM is performed on FOLLOW-UP visit ASSESSMENTS - Nursing Assessment / Reassessment X- 1 10 Reassessment of Co-morbidities (includes updates in patient status) X- 1 5 Reassessment of Adherence to Treatment Plan ASSESSMENTS - Wound and Skin A ssessment / Reassessment []  - 0 Simple Wound Assessment / Reassessment - one wound []  - 0 Complex Wound Assessment / Reassessment - multiple wounds X- 1 10 Dermatologic / Skin Assessment (not related to wound area) ASSESSMENTS - Focused Assessment X- 1 5 Circumferential Edema Measurements - multi extremities []  - 0 Nutritional Assessment / Counseling / Intervention Deanna [name], Deanna [name] ([id number]) [id number] Page 2 of 6 []  - 0 Lower Extremity Assessment (monofilament, tuning fork, pulses) []  - 0 Peripheral Arterial Disease Assessment (using hand held doppler) ASSESSMENTS - Ostomy and/or Continence Assessment and Care []  - 0 Incontinence Assessment and Management []  - 0 Ostomy Care Assessment and Management (repouching, etc.) PROCESS - Coordination of Care X - Simple Patient / Family Education for ongoing care 1 15 []  - 0 Complex (extensive) Patient / Family Education for ongoing care X- 1 10 Staff obtains Chiropractor, Records, T Results / Process Orders est []  - 0 Staff telephones HHA, Nursing Homes / Clarify orders / etc []  - 0 Routine Transfer to another Facility (non-emergent condition) []  - 0 Routine Hospital Admission (non-emergent condition) []  - 0 New Admissions / Manufacturing engineer / Ordering NPWT Apligraf, etc. , []  - 0 Emergency Hospital Admission (emergent condition) X- 1 10 Simple Discharge Coordination []  - 0 Complex (extensive) Discharge Coordination PROCESS - Special Needs []  - 0 Pediatric / Minor Patient Management []  - 0 Isolation Patient Management []  - 0 Hearing / Language / Visual special needs []  - 0 Assessment of Community assistance (transportation,  D/C planning, etc.) []  - 0 Additional assistance / Altered mentation []  - 0 Support Surface(s) Assessment (bed, cushion, seat, etc.) INTERVENTIONS - Wound Cleansing / Measurement X - Simple Wound Cleansing - one wound 1 5 []  -  0 Complex Wound Cleansing - multiple wounds X- 1 5 Wound Imaging (photographs - any number of wounds) []  - 0 Wound Tracing (instead of photographs) X- 1 5 Simple Wound Measurement - one wound []  - 0 Complex Wound Measurement - multiple wounds INTERVENTIONS - Wound Dressings X - Small Wound Dressing one or multiple wounds 1 10 []  - 0 Medium Wound Dressing one or multiple wounds []  - 0 Large Wound Dressing one or multiple wounds []  - 0 Application of Medications - topical []  - 0 Application of Medications - injection INTERVENTIONS - Miscellaneous []  - 0 External ear exam []  - 0 Specimen Collection (cultures, biopsies, blood, body fluids, etc.) []  - 0 Specimen(s) / Culture(s) sent or taken to Lab for analysis []  - 0 Patient Transfer (multiple staff / Nurse, adult / Similar devices) []  - 0 Simple Staple / Suture removal (25 or less) []  - 0 Complex Staple / Suture removal (26 or more) []  - 0 Hypo / Hyperglycemic Management (close monitor of Blood Glucose) Deanna [name], Deanna [name] ([id number]) [id number] Page 3 of 6 []  - 0 Ankle / Brachial Index (ABI) - do not check if billed separately X- 1 5 Vital Signs Has the patient been seen at the hospital within the last three years: Yes Total Score: 95 Level Of Care: New/Established - Level 3 Electronic Signature(s) Signed: 10/12/2022 5:35:38 PM By: Shawn Stall RN, BSN Entered By: Shawn Stall on 10/12/2022 14:52:25 -------------------------------------------------------------------------------- Encounter Discharge Information Details Patient Name: Date of Service: Deanna [name] 10/12/2022 2:15 PM Medical Record Number: [id number] Patient Account Number: [id number] Date of  Birth/Sex: Treating RN: [birth year] (52 y.o. Arta Silence Primary Care Karisma Meiser: Rodman Pickle Other Clinician: Referring Rutherford Alarie: Treating Micalah Cabezas/Extender: York Grice in Treatment: 7 Encounter Discharge Information Items Discharge Condition: Stable Ambulatory Status: Ambulatory Discharge Destination: Home Transportation: Private Auto Accompanied By: self Schedule Follow-up Appointment: Yes Clinical Summary of Care: Electronic Signature(s) Signed: 10/12/2022 5:35:38 PM By: Shawn Stall RN, BSN Entered By: Shawn Stall on 10/12/2022 14:52:49 -------------------------------------------------------------------------------- Lower Extremity Assessment Details Patient Name: Date of Service: Deanna [name] 10/12/2022 2:15 PM Medical Record Number: [id number] Patient Account Number: [id number] Date of Birth/Sex: Treating RN: 1971/02/11 (52 y.o. F) Primary Care Daviyon Widmayer: Rodman Pickle Other Clinician: Referring Terriana Barreras: Treating Phenix Vandermeulen/Extender: Claris Gladden, Lauren Weeks in Treatment: 7 Edema Assessment Assessed: [Left: No] [Right: No] Edema: [Left: N] [Right: o] Calf Left: Right: Point of Measurement: From Medial Instep 38 cm Ankle Left: Right: Point of Measurement: From Medial Instep 20.5 cm Electronic Signature(s) Signed: 10/16/2022 11:18:15 AM By: Parks Ranger ([id number]) AM By: Romeo Apple.pdf Page 4 of 6 Signed: 10/16/2022 11:18:15 Entered By: Thayer Dallas on 10/12/2022 14:30:48 -------------------------------------------------------------------------------- Multi Wound Chart Details Patient Name: Date of Service: Deanna [name], Deanna [name] 10/12/2022 2:15 PM Medical Record Number: [id number] Patient Account Number: [id number] Date of Birth/Sex: Treating RN: 1970/07/11 (52 y.o. F) Primary Care Detra Bores: Rodman Pickle Other Clinician: Referring Travaris Kosh: Treating  Deena Shaub/Extender: Claris Gladden, Lauren Weeks in Treatment: 7 Vital Signs Height(in): Pulse(bpm): 70 Weight(lbs): Blood Pressure(mmHg): 144/89 Body Mass Index(BMI): Temperature(F): 98.1 Respiratory Rate(breaths/min): 18 [Treatment Notes:Wound Assessments Treatment Notes] Electronic Signature(s) Signed: 10/12/2022 4:29:34 PM By: Geralyn Corwin DO Entered By: Geralyn Corwin on 10/12/2022 15:10:09 -------------------------------------------------------------------------------- Multi-Disciplinary Care Plan Details Patient Name: Date of Service: Deanna [name] 10/12/2022 2:15 PM Medical Record Number: [id number] Patient Account Number: [id number] Date of Birth/Sex: Treating RN: March 26, 1971 (52 y.o. Arta Silence Primary Care Cariah Salatino: Rodman Pickle  Other Clinician: Referring Clarita Mcelvain: Treating Waqas Bruhl/Extender: York Grice in Treatment: 7 Active Inactive Electronic Signature(s) Signed: 10/12/2022 5:35:38 PM By: Shawn Stall RN, BSN Entered By: Shawn Stall on 10/12/2022 14:50:52 -------------------------------------------------------------------------------- Pain Assessment Details Patient Name: Date of Service: Deanna [name] 10/12/2022 2:15 PM Medical Record Number: [id number] Patient Account Number: [id number] Date of Birth/Sex: Treating RN: Sep 24, 1970 (52 y.o. F) Primary Care Amanee Iacovelli: Rodman Pickle Other Clinician: RAASHIDA, Deanna [name] ([id number]) [id number] Page 5 of 6 Referring Kardell Virgil: Treating Carlinda Ohlson/Extender: York Grice in Treatment: 7 Active Problems Location of Pain Severity and Description of Pain Patient Has Paino No Site Locations Pain Management and Medication Current Pain Management: Electronic Signature(s) Signed: 10/16/2022 11:18:15 AM By: Thayer Dallas Entered By: Thayer Dallas on 10/12/2022  14:21:40 -------------------------------------------------------------------------------- Patient/Caregiver Education Details Patient Name: Date of Service: Deanna [name] 6/24/2024andnbsp2:15 PM Medical Record Number: [id number] Patient Account Number: [id number] Date of Birth/Gender: Treating RN: [birth year] (52 y.o. Arta Silence Primary Care Physician: Rodman Pickle Other Clinician: Referring Physician: Treating Physician/Extender: York Grice in Treatment: 7 Education Assessment Education Provided To: Patient Education Topics Provided Electronic Signature(s) Signed: 10/12/2022 5:35:38 PM By: Shawn Stall RN, BSN Entered By: Shawn Stall on 10/12/2022 14:50:43 Vitals Details -------------------------------------------------------------------------------- Annette Stable ([id number]) [id number] Page 6 of 6 Patient Name: Date of Service: Deanna [name], Deanna [name] 10/12/2022 2:15 PM Medical Record Number: [id number] Patient Account Number: [id number] Date of Birth/Sex: Treating RN: [birth year] (52 y.o. F) Primary Care Marycruz Boehner: Rodman Pickle Other Clinician: Referring Alontae Chaloux: Treating Nia Nathaniel/Extender: Claris Gladden, Lauren Weeks in Treatment: 7 Vital Signs Time Taken: 14:25 Temperature (F): 98.1 Pulse (bpm): 70 Respiratory Rate (breaths/min): 18 Blood Pressure (mmHg): 144/89 Reference Range: 80 - 120 mg / dl Electronic Signature(s) Signed: 10/16/2022 11:18:15 AM By: Thayer Dallas Entered By: Thayer Dallas on 10/12/2022 14:25:32

## 2022-10-19 NOTE — Progress Notes (Signed)
LEOR, CASCELLA ([id number]) [id number] Page 1 of 6 Visit Report for 09/17/2022 Arrival Information Details Patient Name: Date of Service: [place] Deanna [name], Deanna [name] 09/17/2022 2:45 PM Medical Record Number: [id number] Patient Account Number: [id number] Date of Birth/Sex: Treating RN: 05/26/1970 (52 y.o. F) Primary Care Jhordyn Hoopingarner: Rodman Pickle Other Clinician: Referring Wayne Brunker: Treating Tennie Grussing/Extender: York Grice in Treatment: 3 Visit Information History Since Last Visit Added or deleted any medications: No Patient Arrived: Ambulatory Any new allergies or adverse reactions: No Arrival Time: 14:37 Had a fall or experienced change in No Accompanied By: self activities of daily living that may affect Transfer Assistance: None risk of falls: Patient Identification Verified: Yes Signs or symptoms of abuse/neglect since last visito No Secondary Verification Process Completed: Yes Hospitalized since last visit: No Patient Requires Transmission-Based Precautions: No Implantable device outside of the clinic excluding No Patient Has Alerts: No cellular tissue based products placed in the center since last visit: Has Dressing in Place as Prescribed: Yes Pain Present Now: No Electronic Signature(s) Signed: 09/17/2022 4:41:57 PM By: Thayer Dallas Entered By: Thayer Dallas on 09/17/2022 14:38:25 -------------------------------------------------------------------------------- Clinic Level of Care Assessment Details Patient Name: Date of Service: [place] Deanna [name], Deanna [name] 09/17/2022 2:45 PM Medical Record Number: [id number] Patient Account Number: [id number] Date of Birth/Sex: Treating RN: [birth year] (52 y.o. F) Primary Care Danella Philson: Rodman Pickle Other Clinician: Referring Devansh Riese: Treating Arianie Couse/Extender: York Grice in Treatment: 3 Clinic Level of Care Assessment Items TOOL 4 Quantity Score []  - 0 Use when  only an EandM is performed on FOLLOW-UP visit ASSESSMENTS - Nursing Assessment / Reassessment X- 1 10 Reassessment of Co-morbidities (includes updates in patient status) X- 1 5 Reassessment of Adherence to Treatment Plan ASSESSMENTS - Wound and Skin A ssessment / Reassessment X - Simple Wound Assessment / Reassessment - one wound 1 5 []  - 0 Complex Wound Assessment / Reassessment - multiple wounds []  - 0 Dermatologic / Skin Assessment (not related to wound area) ASSESSMENTS - Focused Assessment X- 1 5 Circumferential Edema Measurements - multi extremities []  - 0 Nutritional Assessment / Counseling / Intervention BRANIGAN, HANELINE ([id number]) [id number] Page 2 of 6 []  - 0 Lower Extremity Assessment (monofilament, tuning fork, pulses) []  - 0 Peripheral Arterial Disease Assessment (using hand held doppler) ASSESSMENTS - Ostomy and/or Continence Assessment and Care []  - 0 Incontinence Assessment and Management []  - 0 Ostomy Care Assessment and Management (repouching, etc.) PROCESS - Coordination of Care X - Simple Patient / Family Education for ongoing care 1 15 []  - 0 Complex (extensive) Patient / Family Education for ongoing care X- 1 10 Staff obtains Chiropractor, Records, T Results / Process Orders est []  - 0 Staff telephones HHA, Nursing Homes / Clarify orders / etc []  - 0 Routine Transfer to another Facility (non-emergent condition) []  - 0 Routine Hospital Admission (non-emergent condition) []  - 0 New Admissions / Manufacturing engineer / Ordering NPWT Apligraf, etc. , []  - 0 Emergency Hospital Admission (emergent condition) []  - 0 Simple Discharge Coordination []  - 0 Complex (extensive) Discharge Coordination PROCESS - Special Needs []  - 0 Pediatric / Minor Patient Management []  - 0 Isolation Patient Management []  - 0 Hearing / Language / Visual special needs []  - 0 Assessment of Community assistance (transportation, D/C planning,  etc.) []  - 0 Additional assistance / Altered mentation []  - 0 Support Surface(s) Assessment (bed, cushion, seat, etc.) INTERVENTIONS - Wound Cleansing / Measurement X - Simple Wound Cleansing - one wound 1 5 []  -  0 Complex Wound Cleansing - multiple wounds X- 1 5 Wound Imaging (photographs - any number of wounds) []  - 0 Wound Tracing (instead of photographs) X- 1 5 Simple Wound Measurement - one wound []  - 0 Complex Wound Measurement - multiple wounds INTERVENTIONS - Wound Dressings X - Small Wound Dressing one or multiple wounds 1 10 []  - 0 Medium Wound Dressing one or multiple wounds []  - 0 Large Wound Dressing one or multiple wounds []  - 0 Application of Medications - topical []  - 0 Application of Medications - injection INTERVENTIONS - Miscellaneous []  - 0 External ear exam []  - 0 Specimen Collection (cultures, biopsies, blood, body fluids, etc.) []  - 0 Specimen(s) / Culture(s) sent or taken to Lab for analysis []  - 0 Patient Transfer (multiple staff / Nurse, adult / Similar devices) []  - 0 Simple Staple / Suture removal (25 or less) []  - 0 Complex Staple / Suture removal (26 or more) []  - 0 Hypo / Hyperglycemic Management (close monitor of Blood Glucose) ENDIYA, GENDREAU ([id number]) [id number] Page 3 of 6 []  - 0 Ankle / Brachial Index (ABI) - do not check if billed separately X- 1 5 Vital Signs Has the patient been seen at the hospital within the last three years: Yes Total Score: 80 Level Of Care: New/Established - Level 3 Electronic Signature(s) Signed: 10/19/2022 3:00:20 PM By: Pearletha Alfred Entered By: Pearletha Alfred on 09/18/2022 08:44:15 -------------------------------------------------------------------------------- Lower Extremity Assessment Details Patient Name: Date of Service: [name], Deanna [name] 09/17/2022 2:45 PM Medical Record Number: [id number] Patient Account Number: [id number] Date of Birth/Sex: Treating RN: 06/05/1970  (52 y.o. F) Primary Care Shonita Rinck: Rodman Pickle Other Clinician: Referring Eragon Hammond: Treating Takiah Maiden/Extender: Claris Gladden, Lauren Weeks in Treatment: 3 Edema Assessment Assessed: [Left: No] [Right: No] Edema: [Left: N] [Right: o] Calf Left: Right: Point of Measurement: From Medial Instep 38 cm Ankle Left: Right: Point of Measurement: From Medial Instep 20.5 cm Electronic Signature(s) Signed: 09/17/2022 4:41:57 PM By: Thayer Dallas Entered By: Thayer Dallas on 09/17/2022 14:49:59 -------------------------------------------------------------------------------- Multi Wound Chart Details Patient Name: Date of Service: [name] 09/17/2022 2:45 PM Medical Record Number: [id number] Patient Account Number: [id number] Date of Birth/Sex: Treating RN: 04-21-1970 (52 y.o. F) Primary Care Kanon Novosel: Rodman Pickle Other Clinician: Referring Arkel Cartwright: Treating Rudie Sermons/Extender: Claris Gladden, Lauren Weeks in Treatment: 3 Vital Signs Height(in): Pulse(bpm): 82 Weight(lbs): Blood Pressure(mmHg): 126/82 Body Mass Index(BMI): Temperature(F): 98.3 Respiratory Rate(breaths/min): 18 [1:Photos:] [N/A:N/A] Left Calcaneus N/A N/A Wound Location: Gradually Appeared N/A N/A Wounding Event: Open Surgical Wound N/A N/A Primary Etiology: 07/28/2022 N/A N/A Date Acquired: 3 N/A N/A Weeks of Treatment: Open N/A N/A Wound Status: No N/A N/A Wound Recurrence: 0.2x0.2x0.1 N/A N/A Measurements L x W x D (cm) 0.031 N/A N/A A (cm) : rea 0.003 N/A N/A Volume (cm) : 98.90% N/A N/A % Reduction in A rea: 99.60% N/A N/A % Reduction in Volume: Full Thickness With Exposed Support N/A N/A Classification: Structures Medium N/A N/A Exudate Amount: Serosanguineous N/A N/A Exudate Type: red, brown N/A N/A Exudate Color: Distinct, outline attached N/A N/A Wound Margin: Large (67-100%) N/A N/A Granulation Amount: Red N/A N/A Granulation Quality: None  Present (0%) N/A N/A Necrotic Amount: Fat Layer (Subcutaneous Tissue): Yes N/A N/A Exposed Structures: Fascia: No Tendon: No Muscle: No Joint: No Bone: No None N/A N/A Epithelialization: Callus: Yes N/A N/A Periwound Skin Texture: Excoriation: No Induration: No Crepitus: No Rash: No Scarring: No Maceration: No N/A N/A Periwound Skin Moisture: Dry/Scaly: No Atrophie Blanche:  No N/A N/A Periwound Skin Color: Cyanosis: No Ecchymosis: No Erythema: No Hemosiderin Staining: No Mottled: No Pallor: No Rubor: No No Abnormality N/A N/A Temperature: Yes N/A N/A Tenderness on Palpation: Treatment Notes Electronic Signature(s) Signed: 09/17/2022 4:34:47 PM By: Geralyn Corwin DO Entered By: Geralyn Corwin on 09/17/2022 15:57:34 -------------------------------------------------------------------------------- Pain Assessment Details Patient Name: Date of Service: [name] 09/17/2022 2:45 PM Medical Record Number: [id number] Patient Account Number: [id number] Date of Birth/Sex: Treating RN: 06-08-1970 (52 y.o. F) Primary Care Kandiss Ihrig: Rodman Pickle Other Clinician: Referring Kaile Bixler: Treating Nanda Bittick/Extender: York Grice in Treatment: 3 Active Problems Location of Pain Severity and Description of Pain MATTIE, ULINSKI ([id number]) [id number] Page 5 of 6 Patient Has Paino No Site Locations Pain Management and Medication Current Pain Management: Electronic Signature(s) Signed: 09/17/2022 4:41:57 PM By: Thayer Dallas Entered By: Thayer Dallas on 09/17/2022 14:51:38 -------------------------------------------------------------------------------- Wound Assessment Details Patient Name: Date of Service: [name], Deanna [name] 09/17/2022 2:45 PM Medical Record Number: [id number] Patient Account Number: [id number] Date of Birth/Sex: Treating RN: [birth year] (52 y.o. F) Primary Care Hazel Leveille: Rodman Pickle Other  Clinician: Referring Alynn Ellithorpe: Treating Tommy Minichiello/Extender: Claris Gladden, Lauren Weeks in Treatment: 3 Wound Status Wound Number: 1 Primary Etiology: Open Surgical Wound Wound Location: Left Calcaneus Wound Status: Open Wounding Event: Gradually Appeared Date Acquired: 07/28/2022 Weeks Of Treatment: 3 Clustered Wound: No Photos Wound Measurements Length: (cm) 0.2 Width: (cm) 0.2 Depth: (cm) 0.1 Area: (cm) 0.03 Volume: (cm) 0.00 MARYKAYE, Deanna [name] ([id number]) Wound Description Classification: Full Thickness With Exposed Suppo Wound Margin: Distinct, outline attached Exudate Amount: Medium Exudate Type: Serosanguineous Exudate Color: red, brown Foul Odor After Cleansing: Slough/Fibrino % Reduction in Area: 98.9% % Reduction in Volume: 99.6% Epithelialization: None 1 Tunneling: No 3 Undermining: No [id number] Page 6 of 6 rt Structures No Yes Wound Bed Granulation Amount: Large (67-100%) Exposed Structure Granulation Quality: Red Fascia Exposed: No Necrotic Amount: None Present (0%) Fat Layer (Subcutaneous Tissue) Exposed: Yes Tendon Exposed: No Muscle Exposed: No Joint Exposed: No Bone Exposed: No Periwound Skin Texture Texture Color No Abnormalities Noted: No No Abnormalities Noted: Yes Callus: Yes Temperature / Pain Crepitus: No Temperature: No Abnormality Excoriation: No Tenderness on Palpation: Yes Induration: No Rash: No Scarring: No Moisture No Abnormalities Noted: Yes Electronic Signature(s) Signed: 09/17/2022 4:41:57 PM By: Thayer Dallas Entered By: Thayer Dallas on 09/17/2022 14:49:24 -------------------------------------------------------------------------------- Vitals Details Patient Name: Date of Service: [name] 09/17/2022 2:45 PM Medical Record Number: [id number] Patient Account Number: [id number] Date of Birth/Sex: Treating RN: August 09, 1970 (52 y.o. F) Primary Care Eman Morimoto: Rodman Pickle  Other Clinician: Referring Vincie Linn: Treating Ren Aspinall/Extender: Claris Gladden, Lauren Weeks in Treatment: 3 Vital Signs Time Taken: 14:50 Temperature (F): 98.3 Pulse (bpm): 82 Respiratory Rate (breaths/min): 18 Blood Pressure (mmHg): 126/82 Reference Range: 80 - 120 mg / dl Electronic Signature(s) Signed: 09/17/2022 4:41:57 PM By: Thayer Dallas Entered By: Thayer Dallas on 09/17/2022 14:51:28

## 2022-12-11 ENCOUNTER — Other Ambulatory Visit: Payer: Self-pay

## 2022-12-11 ENCOUNTER — Ambulatory Visit
Admission: RE | Admit: 2022-12-11 | Discharge: 2022-12-11 | Disposition: A | Discharge status: Home / Self Care | Payer: No Typology Code available for payment source | Source: Ambulatory Visit | Attending: Internal Medicine | Admitting: Internal Medicine

## 2022-12-11 VITALS — BP 137/90 | HR 88 | Temp 98.3°F | Resp 18

## 2022-12-11 DIAGNOSIS — J209 Acute bronchitis, unspecified: Secondary | ICD-10-CM | POA: Diagnosis not present

## 2022-12-11 DIAGNOSIS — U071 COVID-19: Secondary | ICD-10-CM | POA: Insufficient documentation

## 2022-12-11 DIAGNOSIS — R051 Acute cough: Secondary | ICD-10-CM

## 2022-12-11 MED ORDER — AMOXICILLIN-POT CLAVULANATE 875-125 MG PO TABS: 1.0000 | ORAL_TABLET | Freq: Two times a day (BID) | ORAL | 0 refills | Status: DC

## 2022-12-11 MED ORDER — BENZONATATE 200 MG PO CAPS: 200.0000 mg | ORAL_CAPSULE | Freq: Three times a day (TID) | ORAL | 0 refills | Status: DC | PRN

## 2022-12-11 NOTE — ED Triage Notes (Signed)
Pt presents with a persistent cough.   Home interventions: Emergen-C, zicam nasal and drinking teas/fluids. Pt states she has taken allergy medicine at night to sleep at night.

## 2022-12-11 NOTE — ED Provider Notes (Signed)
[facility]    CSN: [id number] Arrival date & time: 12/11/22  1646      History   Chief Complaint Chief Complaint  Patient presents with   Cough    Appeared to be flu with systems; started with runny/stuffed up nose, coughing, moved to chills & sore throat. Late into day 2 I woke up burning with fever. There was also nausea and loss of appetite for the first few days. I'm still coughing. - Entered by patient    HPI Deanna [name] is a 52 y.o. female  presents for evaluation of URI symptoms for 10 days. Patient reports associated symptoms of dry and productive cough.  States she had fevers at the beginning of symptoms.  Denies N/V/D, ear pain, sore throat, body aches, shortness of breath. Patient does not have a hx of asthma or smoking. No known sick contacts.  Pt has taken cough drops and allergy medicine OTC for symptoms. Pt has no other concerns at this time.    Cough   Past Medical History:  Diagnosis Date   Allergy    BV (bacterial vaginosis)    GERD (gastroesophageal reflux disease)    HSV-1 infection    IBS (irritable bowel syndrome)    Lipoma    Near syncope 02/21/2014   Ovarian cyst    Syncope 02/21/2014   Yeast infection     Patient Active Problem List   Diagnosis Date Noted   Routine general medical examination at a health care facility 09/18/2022   Episodic lightheadedness 09/18/2022   Hot flashes 06/09/2022   Chronic left-sided thoracic back pain 03/26/2022   Palpitations 03/26/2022   Syncope 02/21/2014   Near syncope 02/21/2014   Fibroids 02/09/2012   HSV-1 infection     Past Surgical History:  Procedure Laterality Date   BREAST BIOPSY  04/20/2006   LAPAROSCOPY  04/20/1989   no endometriosis found   LIPOMA EXCISION Right 1994   shoulder   SUPRACERVICAL ABDOMINAL HYSTERECTOMY N/A 07/09/2015   Procedure: HYSTERECTOMY SUPRACERVICAL ABDOMINAL with Bilateral Salpingectomy;  Surgeon: Hal Morales, MD;  Location: [facility];  Service:  Gynecology;  Laterality: N/A;    OB History     Gravida  3   Para  1   Term  1   Preterm      AB  2   Living  1      SAB  1   IAB  1   Ectopic      Multiple      Live Births  1            Home Medications    Prior to Admission medications   Medication Sig Start Date End Date Taking? Authorizing Provider  amoxicillin-clavulanate (AUGMENTIN) 875-125 MG tablet Take 1 tablet by mouth every 12 (twelve) hours. 12/11/22  Yes Radford Pax, NP  benzonatate (TESSALON) 200 MG capsule Take 1 capsule (200 mg total) by mouth 3 (three) times daily as needed. 12/11/22  Yes Radford Pax, NP  cetirizine (ZYRTEC) 10 MG tablet Take 10 mg by mouth daily.    [provider]  Fluocinolone Acetonide Scalp 0.01 % OIL PLEASE SEE ATTACHED FOR DETAILED DIRECTIONS Patient not taking: Reported on 09/18/2022 07/30/22   [provider]  ketoconazole (NIZORAL) 2 % shampoo PLEASE SEE ATTACHED FOR DETAILED DIRECTIONS Patient not taking: Reported on 09/18/2022 07/30/22   [provider]  Multiple Vitamins-Minerals (WOMENS 50+ MULTI VITAMIN/MIN) TABS Take by mouth. Patient not taking: Reported on  09/18/2022    [provider]  UNABLE TO FIND Med Name: OTC Vitamin D3    [provider]  UNABLE TO FIND Med Name: Vitamin B12    [provider]  valACYclovir (VALTREX) 1000 MG tablet Take 1,000 mg by mouth as needed (for cold sores). 05/19/22   [provider]    Family History Family History  Problem Relation Age of Onset   Anemia Mother    Throat cancer Paternal Uncle    Cancer Paternal Uncle        prostate   Heart disease Maternal Grandmother    Hypertension Maternal Grandmother    Cancer Paternal Grandfather    Sickle cell trait Cousin     Social History Social History   Tobacco Use   Smoking status: Never    Passive exposure: Never   Smokeless tobacco: Never  Vaping Use   Vaping status: Never Used  Substance Use Topics    Alcohol use: Yes    Comment: wine occassional   Drug use: No     Allergies   Dust mite extract   Review of Systems Review of Systems  Respiratory:  Positive for cough.      Physical Exam Triage Vital Signs ED Triage Vitals  Encounter Vitals Group     BP 12/11/22 1657 (!) 137/90     Systolic BP Percentile --      Diastolic BP Percentile --      Pulse Rate 12/11/22 1657 88     Resp 12/11/22 1657 18     Temp 12/11/22 1703 98.3 F (36.8 C)     Temp Source 12/11/22 1657 Oral     SpO2 12/11/22 1657 97 %     Weight --      Height --      Head Circumference --      Peak Flow --      Pain Score 12/11/22 1656 3     Pain Loc --      Pain Education --      Exclude from Growth Chart --    No data found.  Updated Vital Signs BP (!) 137/90 (BP Location: Left Arm)   Pulse 88   Temp 98.3 F (36.8 C) (Oral)   Resp 18   LMP 05/25/2015 (Approximate)   SpO2 97%   Visual Acuity Right Eye Distance:   Left Eye Distance:   Bilateral Distance:    Right Eye Near:   Left Eye Near:    Bilateral Near:     Physical Exam Vitals and nursing note reviewed.  Constitutional:      General: She is not in acute distress.    Appearance: She is well-developed. She is not ill-appearing.  HENT:     Head: Normocephalic and atraumatic.     Right Ear: Tympanic membrane and ear canal normal.     Left Ear: Tympanic membrane and ear canal normal.     Nose: Congestion present.     Mouth/Throat:     Mouth: Mucous membranes are moist.     Pharynx: Oropharynx is clear. Uvula midline. No posterior oropharyngeal erythema.     Tonsils: No tonsillar exudate or tonsillar abscesses.  Eyes:     Conjunctiva/sclera: Conjunctivae normal.     Pupils: Pupils are equal, round, and reactive to light.  Cardiovascular:     Rate and Rhythm: Normal rate and regular rhythm.     Heart sounds: Normal heart sounds.  Pulmonary:     Effort: Pulmonary  effort is normal.     Breath sounds: Normal breath sounds.   Musculoskeletal:     Cervical back: Normal range of motion and neck supple.  Lymphadenopathy:     Cervical: No cervical adenopathy.  Skin:    General: Skin is warm and dry.  Neurological:     General: No focal deficit present.     Mental Status: She is alert and oriented to person, place, and time.  Psychiatric:        Mood and Affect: Mood normal.        Behavior: Behavior normal.      UC Treatments / Results  Labs (all labs ordered are listed, but only abnormal results are displayed) Labs Reviewed  SARS CORONAVIRUS 2 (TAT 6-24 HRS)    EKG   Radiology No results found.  Procedures Procedures (including critical care time)  Medications Ordered in UC Medications - No data to display  Initial Impression / Assessment and Plan / UC Course  I have reviewed the triage vital signs and the nursing notes.  Pertinent labs & imaging results that were available during my care of the patient were reviewed by me and considered in my medical decision making (see chart for details).     Reviewed exam and symptoms with patient.  No red flags.  COVID PCR per patient request.  Did discuss this will not change treatment but she states she needs it for her job.  Discussed bronchitis.  Start Augmentin and Tessalon.  Continue allergy medicine as needed.  She declines Flonase as it makes her nosebleed.  Lots of rest and fluids.  PCP follow-up if her symptoms do not improve.  ER precautions reviewed Final Clinical Impressions(s) / UC Diagnoses   Final diagnoses:  Acute cough  Acute bronchitis, unspecified organism     Discharge Instructions      Start Augmentin twice daily for 7 days.  You may take Tessalon that she need to for cough.  Continue your over-the-counter allergy medicine as well.  Lots of rest and fluids.  Please follow-up with your PCP if your symptoms do not improve.  Please go to the emergency room if you develop any worsening symptoms.  I hope you feel better  soon!   ED Prescriptions     Medication Sig Dispense Auth. Provider   amoxicillin-clavulanate (AUGMENTIN) 875-125 MG tablet Take 1 tablet by mouth every 12 (twelve) hours. 14 tablet Radford Pax, NP   benzonatate (TESSALON) 200 MG capsule Take 1 capsule (200 mg total) by mouth 3 (three) times daily as needed. 20 capsule Radford Pax, NP      PDMP not reviewed this encounter.   Radford Pax, NP 12/11/22 [phone]

## 2022-12-11 NOTE — Discharge Instructions (Signed)
Start Augmentin twice daily for 7 days.  You may take Tessalon that she need to for cough.  Continue your over-the-counter allergy medicine as well.  Lots of rest and fluids.  Please follow-up with your PCP if your symptoms do not improve.  Please go to the emergency room if you develop any worsening symptoms.  I hope you feel better soon!

## 2022-12-12 LAB — SARS CORONAVIRUS 2 (TAT 6-24 HRS): SARS Coronavirus 2: POSITIVE — AB

## 2022-12-15 ENCOUNTER — Telehealth (HOSPITAL_COMMUNITY): Payer: Self-pay | Admitting: Emergency Medicine

## 2022-12-15 NOTE — Telephone Encounter (Signed)
Patient left voicemail with questions about her COVID test.  On return call from this RN all patient's questions were answered and she verbalized understanding of plan and that she can return to normal activities.

## 2023-03-04 ENCOUNTER — Encounter: Payer: Self-pay | Admitting: Nurse Practitioner

## 2023-03-04 ENCOUNTER — Telehealth (INDEPENDENT_AMBULATORY_CARE_PROVIDER_SITE_OTHER): Payer: No Typology Code available for payment source | Admitting: Nurse Practitioner

## 2023-03-04 VITALS — Ht 68.75 in | Wt 179.0 lb

## 2023-03-04 DIAGNOSIS — M549 Dorsalgia, unspecified: Secondary | ICD-10-CM

## 2023-03-04 NOTE — Assessment & Plan Note (Signed)
She has been experiencing a "movement" sensation in her right upper back when she twists or bends over for the last 2-3 weeks. She denies pain, but describes it as a discomfort. There is no visible lumps or skin changes via video exam. Will check an ultrasound for further evaluation.

## 2023-03-04 NOTE — Patient Instructions (Signed)
It was great to see you!  I have ordered an ultrasound of your back. They will call to schedule.   Let's follow-up if symptoms worsen or any concerns.   Take care,  Rodman Pickle, NP

## 2023-03-04 NOTE — Progress Notes (Signed)
[facility] [address] [place] [address] Dept: [phone] Dept Fax: [phone]  Virtual Video Visit  I connected with Annette Stable on 03/04/23 at  4:00 PM EST by a video enabled telemedicine application and verified that I am speaking with the correct person using two identifiers.  Location patient: Home Location provider: Clinic Persons participating in the virtual visit: Patient; Rodman Pickle, NP; Malena Peer, CMA  I discussed the limitations of evaluation and management by telemedicine and the availability of in person appointments. The patient expressed understanding and agreed to proceed.  Chief Complaint  Patient presents with   Back Pain    C/o having feeling of a weird movement when bending and moving in her back x 2 weeks.      SUBJECTIVE:  HPI: Deanna [name] is a 52 y.o. female who presents weird sensation in her upper back for the last several weeks. She states that whenever she twists, or moves her back, she feels a "deep, weird, uncomfortable feeling" under the right shoulder blade. She feels like something is moving. She denies lumps in her skin, bruising, or rashes.   Patient Active Problem List   Diagnosis Date Noted   Discomfort of back 03/04/2023   Routine general medical examination at a health care facility 09/18/2022   Episodic lightheadedness 09/18/2022   Hot flashes 06/09/2022   Chronic left-sided thoracic back pain 03/26/2022   Palpitations 03/26/2022   Syncope 02/21/2014   Near syncope 02/21/2014   Fibroids 02/09/2012   HSV-1 infection     Past Surgical History:  Procedure Laterality Date   BREAST BIOPSY  04/20/2006   LAPAROSCOPY  04/20/1989   no endometriosis found   LIPOMA EXCISION Right 1994   shoulder   SUPRACERVICAL ABDOMINAL HYSTERECTOMY N/A 07/09/2015   Procedure: HYSTERECTOMY SUPRACERVICAL ABDOMINAL with Bilateral Salpingectomy;  Surgeon: Hal Morales, MD;  Location: [facility];  Service: Gynecology;  Laterality: N/A;    Family History  Problem Relation Age of Onset   Anemia Mother    Throat cancer Paternal Uncle    Cancer Paternal Uncle        prostate   Heart disease Maternal Grandmother    Hypertension Maternal Grandmother    Cancer Paternal Grandfather    Sickle cell trait Cousin     Social History   Tobacco Use   Smoking status: Never    Passive exposure: Never   Smokeless tobacco: Never  Vaping Use   Vaping status: Never Used  Substance Use Topics   Alcohol use: Yes    Comment: wine occassional   Drug use: No     Current Outpatient Medications:    amoxicillin-clavulanate (AUGMENTIN) 875-125 MG tablet, Take 1 tablet by mouth every 12 (twelve) hours., Disp: 14 tablet, Rfl: 0   benzonatate (TESSALON) 200 MG capsule, Take 1 capsule (200 mg total) by mouth 3 (three) times daily as needed., Disp: 20 capsule, Rfl: 0   cetirizine (ZYRTEC) 10 MG tablet, Take 10 mg by mouth daily., Disp: , Rfl:    Fluocinolone Acetonide Scalp 0.01 % OIL, PLEASE SEE ATTACHED FOR DETAILED DIRECTIONS (Patient not taking: Reported on 09/18/2022), Disp: , Rfl:    ketoconazole (NIZORAL) 2 % shampoo, PLEASE SEE ATTACHED FOR DETAILED DIRECTIONS (Patient not taking: Reported on 09/18/2022), Disp: , Rfl:    Multiple Vitamins-Minerals (WOMENS 50+ MULTI VITAMIN/MIN) TABS, Take by mouth. (Patient not taking: Reported on 09/18/2022), Disp: , Rfl:    UNABLE TO FIND, Med  Name: OTC Vitamin D3, Disp: , Rfl:    UNABLE TO FIND, Med Name: Vitamin B12, Disp: , Rfl:    valACYclovir (VALTREX) 1000 MG tablet, Take 1,000 mg by mouth as needed (for cold sores)., Disp: , Rfl:   Allergies  Allergen Reactions   Dust Mite Extract Itching and Other (See Comments)    ROS: See pertinent positives and negatives per HPI.  OBSERVATIONS/OBJECTIVE:  VITALS per patient if applicable: Today's Vitals   03/04/23 1545  Weight: 179 lb (81.2 kg)  Height: 5' 8.75" (1.746 m)    Body mass index is 26.63 kg/m.    GENERAL: Alert and oriented. Appears well and in no acute distress.  HEENT: Atraumatic. Conjunctiva clear. No obvious abnormalities on inspection of external nose and ears.  NECK: Normal movements of the head and neck.  LUNGS: On inspection, no signs of respiratory distress. Breathing rate appears normal. No obvious gross SOB, gasping or wheezing, and no conversational dyspnea.  CV: No obvious cyanosis.  MS: Moves all visible extremities without noticeable abnormality.  PSYCH/NEURO: Pleasant and cooperative. No obvious depression or anxiety. Speech and thought processing grossly intact.  ASSESSMENT AND PLAN:  Problem List Items Addressed This Visit       Other   Discomfort of back - Primary    She has been experiencing a "movement" sensation in her right upper back when she twists or bends over for the last 2-3 weeks. She denies pain, but describes it as a discomfort. There is no visible lumps or skin changes via video exam. Will check an ultrasound for further evaluation.       Relevant Orders   [place] CHEST SOFT TISSUE     I discussed the assessment and treatment plan with the patient. The patient was provided an opportunity to ask questions and all were answered. The patient agreed with the plan and demonstrated an understanding of the instructions.   The patient was advised to call back or seek an in-person evaluation if the symptoms worsen or if the condition fails to improve as anticipated.   Gerre Scull, NP

## 2023-03-05 ENCOUNTER — Encounter: Payer: Self-pay | Admitting: Nurse Practitioner

## 2023-03-07 ENCOUNTER — Ambulatory Visit (HOSPITAL_BASED_OUTPATIENT_CLINIC_OR_DEPARTMENT_OTHER)
Admission: RE | Admit: 2023-03-07 | Discharge: 2023-03-07 | Disposition: A | Discharge status: Home / Self Care | Payer: No Typology Code available for payment source | Source: Ambulatory Visit | Attending: Nurse Practitioner | Admitting: Nurse Practitioner

## 2023-03-07 DIAGNOSIS — M549 Dorsalgia, unspecified: Secondary | ICD-10-CM | POA: Insufficient documentation

## 2023-03-12 ENCOUNTER — Encounter: Payer: Self-pay | Admitting: Nurse Practitioner

## 2023-03-31 ENCOUNTER — Ambulatory Visit: Payer: No Typology Code available for payment source | Admitting: Nurse Practitioner

## 2023-04-07 ENCOUNTER — Ambulatory Visit: Payer: No Typology Code available for payment source | Admitting: Nurse Practitioner

## 2023-04-07 ENCOUNTER — Telehealth: Payer: Self-pay | Admitting: Nurse Practitioner

## 2023-04-07 NOTE — Telephone Encounter (Signed)
Noted  

## 2023-04-07 NOTE — Telephone Encounter (Signed)
1st missed visit, same day cancel/feeling better, letter sent via [facility]

## 2023-07-20 LAB — HM MAMMOGRAPHY: HM Mammogram: NORMAL (ref 0–4)

## 2023-09-20 ENCOUNTER — Encounter: Payer: No Typology Code available for payment source | Admitting: Nurse Practitioner

## 2023-09-23 ENCOUNTER — Encounter: Payer: Self-pay | Admitting: Nurse Practitioner

## 2023-09-23 ENCOUNTER — Ambulatory Visit (INDEPENDENT_AMBULATORY_CARE_PROVIDER_SITE_OTHER): Admitting: Nurse Practitioner

## 2023-09-23 VITALS — BP 122/84 | HR 80 | Temp 97.4°F | Ht 68.75 in | Wt 173.0 lb

## 2023-09-23 DIAGNOSIS — R252 Cramp and spasm: Secondary | ICD-10-CM

## 2023-09-23 DIAGNOSIS — R42 Dizziness and giddiness: Secondary | ICD-10-CM | POA: Diagnosis not present

## 2023-09-23 DIAGNOSIS — M546 Pain in thoracic spine: Secondary | ICD-10-CM

## 2023-09-23 DIAGNOSIS — R7303 Prediabetes: Secondary | ICD-10-CM | POA: Diagnosis not present

## 2023-09-23 DIAGNOSIS — Z0000 Encounter for general adult medical examination without abnormal findings: Secondary | ICD-10-CM | POA: Diagnosis not present

## 2023-09-23 DIAGNOSIS — B009 Herpesviral infection, unspecified: Secondary | ICD-10-CM

## 2023-09-23 DIAGNOSIS — G8929 Other chronic pain: Secondary | ICD-10-CM | POA: Diagnosis not present

## 2023-09-23 DIAGNOSIS — M79671 Pain in right foot: Secondary | ICD-10-CM | POA: Insufficient documentation

## 2023-09-23 DIAGNOSIS — M79672 Pain in left foot: Secondary | ICD-10-CM

## 2023-09-23 LAB — POCT URINALYSIS DIPSTICK
Bilirubin, UA: NEGATIVE
Blood, UA: NEGATIVE
Glucose, UA: NEGATIVE
Ketones, UA: NEGATIVE
Leukocytes, UA: NEGATIVE
Nitrite, UA: NEGATIVE
Protein, UA: NEGATIVE
Spec Grav, UA: 1.01 (ref 1.010–1.025)
Urobilinogen, UA: 0.2 U/dL
pH, UA: 6.5 (ref 5.0–8.0)

## 2023-09-23 NOTE — Assessment & Plan Note (Signed)
 She has been experiencing left-sided thoracic back pain that radiates around her ribs for the past 2 months. She is still doing stretches and takes tylenol  and ibuprofen  as needed.

## 2023-09-23 NOTE — Progress Notes (Signed)
 BP 122/84 (BP Location: Left Arm, Patient Position: Sitting, Cuff Size: Normal)   Pulse 80   Temp (!) 97.4 F (36.3 C)   Ht 5' 8.75" (1.746 m)   Wt 173 lb (78.5 kg)   LMP 05/25/2015 (Approximate)   SpO2 99%   BMI 25.73 kg/m    Subjective:    Patient ID: Deanna [name], female    DOB: 11/18/1970, 53 y.o.   MRN: [id number]  CC: Chief Complaint  Patient presents with   Annual Exam    With fasting lab work,Concerns with sore on left heel, bilateral foot pain with swelling in ankles    HPI: Deanna [name] is a 53 y.o. female presenting on 09/23/2023 for comprehensive medical examination. Current medical complaints include:foot pain  She experiences persistent soreness in the bones and swelling in the left ankle following foot surgery. The surgical site has not completely healed, and swelling is more pronounced in the left ankle compared to the right. Her toes and feet are often stiff and cramp frequently, especially after physical activities like walking or cutting grass. Despite using arch supports and undergoing surgery, her symptoms persist. She has not been taking any medication for the pain but uses a heated foot massager, which provides some relief.  She has joint pain, particularly in her hands and shoulders. Her hands experience stiffness and soreness, and she has had therapy for shoulder pain in the past. Stretching provides temporary relief for her shoulder pain. She experiences cramping in her legs, especially after physical activities, and notes that her legs and feet cramp when she lies down. She drinks a lot of water, juice, and tea, which she believes helps with her symptoms.  She reports stress-related symptoms, including pain that starts in her back and radiates to her ribs, frequent urination without burning, and light headaches. She attributes these symptoms to stress and menopause, noting that her work environment has become toxic, contributing to her stress  levels.  She currently lives with: alone Menopausal Symptoms: yes - hot flashes, forgetfulness  Depression and Anxiety Screen done today and results listed below:     09/23/2023    3:42 PM 03/25/2022    4:09 PM 04/18/2019   11:29 AM 01/13/2019    1:22 PM 12/29/2018   10:45 AM  Depression screen PHQ 2/9  Decreased Interest 1 0 0 0 0  Down, Depressed, Hopeless 1 0 0 0 0  PHQ - 2 Score 2 0 0 0 0  Altered sleeping 1 0     Tired, decreased energy 2 2     Change in appetite 1 2     Feeling bad or failure about yourself  0 0     Trouble concentrating 1 0     Moving slowly or fidgety/restless 0 0     Suicidal thoughts 0 0     PHQ-9 Score 7 4     Difficult doing work/chores Somewhat difficult Not difficult at all         09/23/2023    3:43 PM 03/25/2022    4:09 PM  GAD 7 : Generalized Anxiety Score  Nervous, Anxious, on Edge 1 1  Control/stop worrying 1 0  Worry too much - different things 1 0  Trouble relaxing 1 0  Restless 0 0  Easily annoyed or irritable 1 1  Afraid - awful might happen 0 0  Total GAD 7 Score 5 2  Anxiety Difficulty Somewhat difficult Not difficult at all  The patient does not have a history of falls. I did not complete a risk assessment for falls. A plan of care for falls was not documented.   Past Medical History:  Past Medical History:  Diagnosis Date   Allergy    BV (bacterial vaginosis)    GERD (gastroesophageal reflux disease)    HSV-1 infection    IBS (irritable bowel syndrome)    Lipoma    Near syncope 02/21/2014   Ovarian cyst    Syncope 02/21/2014   Yeast infection     Surgical History:  Past Surgical History:  Procedure Laterality Date   BREAST BIOPSY  04/20/2006   LAPAROSCOPY  04/20/1989   no endometriosis found   LIPOMA EXCISION Right 1994   shoulder   SUPRACERVICAL ABDOMINAL HYSTERECTOMY N/A 07/09/2015   Procedure: HYSTERECTOMY SUPRACERVICAL ABDOMINAL with Bilateral Salpingectomy;  Surgeon: Stevenson Elbe, MD;  Location: [facility];  Service: Gynecology;  Laterality: N/A;    Medications:  Current Outpatient Medications on File Prior to Visit  Medication Sig   cetirizine (ZYRTEC) 10 MG tablet Take 10 mg by mouth daily.   Multiple Vitamins-Minerals (WOMENS 50+ MULTI VITAMIN/MIN) TABS Take by mouth.   UNABLE TO FIND Med Name: Vitamin B12   valACYclovir  (VALTREX ) 1000 MG tablet Take 1,000 mg by mouth as needed (for cold sores).   Fluocinolone Acetonide Scalp 0.01 % OIL PLEASE SEE ATTACHED FOR DETAILED DIRECTIONS (Patient not taking: Reported on 09/23/2023)   ketoconazole (NIZORAL) 2 % shampoo PLEASE SEE ATTACHED FOR DETAILED DIRECTIONS (Patient not taking: Reported on 09/23/2023)   UNABLE TO FIND Med Name: OTC Vitamin D3 (Patient not taking: Reported on 09/23/2023)   No current facility-administered medications on file prior to visit.    Allergies:  Allergies  Allergen Reactions   Dust Mite Extract Itching and Other (See Comments)    Social History:  Social History   Socioeconomic History   Marital status: Single    Spouse name: Not on file   Number of children: 1   Years of education: BS   Highest education level: Not on file  Occupational History   Occupation: Financial planner  Tobacco Use   Smoking status: Never    Passive exposure: Never   Smokeless tobacco: Never  Vaping Use   Vaping status: Never Used  Substance and Sexual Activity   Alcohol use: Yes    Comment: wine occassional   Drug use: No   Sexual activity: Yes    Birth control/protection: Condom    Comment: orsythia   Other Topics Concern   Not on file  Social History Narrative   Patient drinks coffee and tea 3x a week.   Social Drivers of Corporate investment banker Strain: Low Risk  (09/23/2023)   Overall Financial Resource Strain (CARDIA)    Difficulty of Paying Living Expenses: Not very hard  Food Insecurity: Patient Declined (09/23/2023)   Hunger Vital Sign    Worried About Running Out of Food in the Last Year: Patient declined     Ran Out of Food in the Last Year: Patient declined  Transportation Needs: Patient Declined (09/23/2023)   PRAPARE - Administrator, Civil Service (Medical): Patient declined    Lack of Transportation (Non-Medical): Patient declined  Physical Activity: Insufficiently Active (09/23/2023)   Exercise Vital Sign    Days of Exercise per Week: 1 day    Minutes of Exercise per Session: 30 min  Stress: Stress Concern Present (09/23/2023)   [employer] of  Occupational Health - Occupational Stress Questionnaire    Feeling of Stress : Rather much  Social Connections: Unknown (09/23/2023)   Social Connection and Isolation Panel [NHANES]    Frequency of Communication with Friends and Family: Twice a week    Frequency of Social Gatherings with Friends and Family: Once a week    Attends Religious Services: Patient declined    Database administrator or Organizations: Patient declined    Attends Engineer, structural: Not on file    Marital Status: Patient declined  Catering manager Violence: Not on file   Social History   Tobacco Use  Smoking Status Never   Passive exposure: Never  Smokeless Tobacco Never   Social History   Substance and Sexual Activity  Alcohol Use Yes   Comment: wine occassional    Family History:  Family History  Problem Relation Age of Onset   Anemia Mother    Throat cancer Paternal Uncle    Cancer Paternal Uncle        prostate   Heart disease Maternal Grandmother    Hypertension Maternal Grandmother    Cancer Paternal Grandfather    Sickle cell trait Cousin     Past medical history, surgical history, medications, allergies, family history and social history reviewed with patient today and changes made to appropriate areas of the chart.   Review of Systems  Constitutional: Negative.   HENT: Negative.    Eyes: Negative.   Respiratory: Negative.    Cardiovascular: Negative.   Gastrointestinal: Negative.   Genitourinary:  Positive for  frequency. Negative for dysuria and urgency.  Musculoskeletal:  Positive for joint pain.       Feet pain, cramping  Skin: Negative.   Neurological:  Positive for headaches. Negative for dizziness.  Psychiatric/Behavioral: Negative.     All other ROS negative except what is listed above and in the HPI.      Objective:     BP 122/84 (BP Location: Left Arm, Patient Position: Sitting, Cuff Size: Normal)   Pulse 80   Temp (!) 97.4 F (36.3 C)   Ht 5' 8.75" (1.746 m)   Wt 173 lb (78.5 kg)   LMP 05/25/2015 (Approximate)   SpO2 99%   BMI 25.73 kg/m   Wt Readings from Last 3 Encounters:  09/23/23 173 lb (78.5 kg)  03/04/23 179 lb (81.2 kg)  09/18/22 179 lb 9.6 oz (81.5 kg)    Physical Exam Vitals and nursing note reviewed.  Constitutional:      General: She is not in acute distress.    Appearance: Normal appearance.  HENT:     Head: Normocephalic and atraumatic.     Right Ear: Tympanic membrane, ear canal and external ear normal.     Left Ear: Tympanic membrane, ear canal and external ear normal.  Eyes:     Conjunctiva/sclera: Conjunctivae normal.  Cardiovascular:     Rate and Rhythm: Normal rate and regular rhythm.     Pulses: Normal pulses.     Heart sounds: Normal heart sounds.  Pulmonary:     Effort: Pulmonary effort is normal.     Breath sounds: Normal breath sounds.  Abdominal:     Palpations: Abdomen is soft.     Tenderness: There is no abdominal tenderness.  Musculoskeletal:        General: Normal range of motion.     Cervical back: Normal range of motion and neck supple.     Right lower leg: No edema.  Left lower leg: No edema.  Lymphadenopathy:     Cervical: No cervical adenopathy.  Skin:    General: Skin is warm and dry.     Comments: Healing incision to left heel - possible keloid forming  Neurological:     General: No focal deficit present.     Mental Status: She is alert and oriented to person, place, and time.     Cranial Nerves: No cranial nerve  deficit.     Coordination: Coordination normal.     Gait: Gait normal.  Psychiatric:        Mood and Affect: Mood normal.        Behavior: Behavior normal.        Thought Content: Thought content normal.        Judgment: Judgment normal.     Results for orders placed or performed in visit on 09/23/23  Cologuard   Collection Time: 09/23/22 12:00 AM  Result Value Ref Range   Cologuard Negative Negative  HM MAMMOGRAPHY   Collection Time: 07/20/23 12:00 AM  Result Value Ref Range   HM Mammogram Self Reported Normal 0-4 Bi-Rad, Self Reported Normal      Assessment & Plan:   Problem List Items Addressed This Visit       Other   HSV-1 infection   Chronic, stable. She takes valtrex  as needed for flare-ups.       Chronic left-sided thoracic back pain   She has been experiencing left-sided thoracic back pain that radiates around her ribs for the past 2 months. She is still doing stretches and takes tylenol  and ibuprofen  as needed.       Relevant Orders   POCT urinalysis dipstick   Routine general medical examination at a health care facility - Primary   Health maintenance reviewed and updated. Discussed nutrition, exercise.  Follow-up 1 year.        Episodic lightheadedness   She has been experiencing lightheadedness when she stands too quickly or goes from a squatting to standing position.  Discussed changing positions slowly and making sure that she drinks plenty of fluids.  Check CMP, CBC today.       Relevant Orders   CBC with Differential/Platelet   Comprehensive metabolic panel with GFR   Prediabetes   Chronic, stable. She has tried to adjust her diet. Check CMP, CBC, A1c today.       Relevant Orders   Lipid panel   Hemoglobin A1c   Vitamin B12   Bilateral foot pain   She has been experiencing bilateral foot pain for a few years. She went to podiatry who took x-rays and said everything was normal. She had orthotics however they have not helped. Will check B12,  ANA, and RF today.       Relevant Orders   ANA w/Reflex   Rheumatoid Factor   Other Visit Diagnoses       Muscle cramping       Check CMP, CBC, Mg today. Encourage fluids.   Relevant Orders   Magnesium        Follow up plan: Return in about 1 year (around 09/22/2024) for CPE.   LABORATORY TESTING:  - Pap smear: up to date  IMMUNIZATIONS:   - Tdap: Tetanus vaccination status reviewed: unsure - will check records. - Influenza: Postponed to flu season - Pneumovax: Not applicable - Prevnar: Not applicable - HPV: Not applicable - Shingrix vaccine: Up to date  SCREENING: -Mammogram: Up to date  - Colonoscopy: Up  to date  - Bone Density: Not applicable   PATIENT COUNSELING:   Advised to take 1 mg of folate supplement per day if capable of pregnancy.   Sexuality: Discussed sexually transmitted diseases, partner selection, use of condoms, avoidance of unintended pregnancy  and contraceptive alternatives.   Advised to avoid cigarette smoking.  I discussed with the patient that most people either abstain from alcohol or drink within safe limits (<=14/week and <=4 drinks/occasion for males, <=7/weeks and <= 3 drinks/occasion for females) and that the risk for alcohol disorders and other health effects rises proportionally with the number of drinks per week and how often a drinker exceeds daily limits.  Discussed cessation/primary prevention of drug use and availability of treatment for abuse.   Diet: Encouraged to adjust caloric intake to maintain  or achieve ideal body weight, to reduce intake of dietary saturated fat and total fat, to limit sodium intake by avoiding high sodium foods and not adding table salt, and to maintain adequate dietary potassium and calcium preferably from fresh fruits, vegetables, and low-fat dairy products.    stressed the importance of regular exercise  Injury prevention: Discussed safety belts, safety helmets, smoke detector, smoking near bedding or  upholstery.   Dental health: Discussed importance of regular tooth brushing, flossing, and dental visits.    NEXT PREVENTATIVE PHYSICAL DUE IN 1 YEAR. Return in about 1 year (around 09/22/2024) for CPE.  Jamarius Saha A Ami Mally

## 2023-09-23 NOTE — Assessment & Plan Note (Signed)
 Chronic, stable. She has tried to adjust her diet. Check CMP, CBC, A1c today.

## 2023-09-23 NOTE — Assessment & Plan Note (Signed)
 Chronic, stable. She takes valtrex  as needed for flare-ups.

## 2023-09-23 NOTE — Assessment & Plan Note (Signed)
Health maintenance reviewed and updated. Discussed nutrition, exercise. Follow-up 1 year.

## 2023-09-23 NOTE — Assessment & Plan Note (Signed)
 She has been experiencing lightheadedness when she stands too quickly or goes from a squatting to standing position.  Discussed changing positions slowly and making sure that she drinks plenty of fluids.  Check CMP, CBC today.

## 2023-09-23 NOTE — Assessment & Plan Note (Signed)
 She has been experiencing bilateral foot pain for a few years. She went to podiatry who took x-rays and said everything was normal. She had orthotics however they have not helped. Will check B12, ANA, and RF today.

## 2023-09-23 NOTE — Patient Instructions (Signed)
 It was great to see you!  We are checking your labs today and will let you know the results via mychart/phone.   Look and let me know when your last tetanus vaccine was   Let's follow-up in 1 year, sooner if you have concerns.  If a referral was placed today, you will be contacted for an appointment. Please note that routine referrals can sometimes take up to 3-4 weeks to process. Please call our office if you haven't heard anything after this time frame.  Take care,  Rheba Cedar, NP

## 2023-09-24 ENCOUNTER — Ambulatory Visit: Payer: Self-pay | Admitting: Nurse Practitioner

## 2023-09-24 LAB — COMPREHENSIVE METABOLIC PANEL WITH GFR
ALT: 12 U/L (ref 0–35)
AST: 15 U/L (ref 0–37)
Albumin: 4.6 g/dL (ref 3.5–5.2)
Alkaline Phosphatase: 104 U/L (ref 39–117)
BUN: 7 mg/dL (ref 6–23)
CO2: 28 meq/L (ref 19–32)
Calcium: 9.8 mg/dL (ref 8.4–10.5)
Chloride: 103 meq/L (ref 96–112)
Creatinine, Ser: 0.79 mg/dL (ref 0.40–1.20)
GFR: 84.95 mL/min (ref >60.00)
Glucose, Bld: 79 mg/dL (ref 70–99)
Potassium: 3.9 meq/L (ref 3.5–5.1)
Sodium: 140 meq/L (ref 135–145)
Total Bilirubin: 0.9 mg/dL (ref 0.2–1.2)
Total Protein: 7.3 g/dL (ref 6.0–8.3)

## 2023-09-24 LAB — CBC WITH DIFFERENTIAL/PLATELET
Basophils Absolute: 0 10*3/uL (ref 0.0–0.1)
Basophils Relative: 0.8 % (ref 0.0–3.0)
Eosinophils Absolute: 0 10*3/uL (ref 0.0–0.7)
Eosinophils Relative: 0.7 % (ref 0.0–5.0)
HCT: 37.7 % (ref 36.0–46.0)
Hemoglobin: 12.4 g/dL (ref 12.0–15.0)
Lymphocytes Relative: 34.7 % (ref 12.0–46.0)
Lymphs Abs: 1.3 10*3/uL (ref 0.7–4.0)
MCHC: 32.9 g/dL (ref 30.0–36.0)
MCV: 83.5 fl (ref 78.0–100.0)
Monocytes Absolute: 0.3 10*3/uL (ref 0.1–1.0)
Monocytes Relative: 7.8 % (ref 3.0–12.0)
Neutro Abs: 2.1 10*3/uL (ref 1.4–7.7)
Neutrophils Relative %: 56 % (ref 43.0–77.0)
Platelets: 216 10*3/uL (ref 150.0–400.0)
RBC: 4.52 Mil/uL (ref 3.87–5.11)
RDW: 13 % (ref 11.5–15.5)
WBC: 3.7 10*3/uL — ABNORMAL LOW (ref 4.0–10.5)

## 2023-09-24 LAB — LIPID PANEL
Cholesterol: 210 mg/dL — ABNORMAL HIGH (ref 0–200)
HDL: 82 mg/dL (ref >39.00)
LDL Cholesterol: 115 mg/dL — ABNORMAL HIGH (ref 0–99)
NonHDL: 128.19
Total CHOL/HDL Ratio: 3
Triglycerides: 67 mg/dL (ref 0.0–149.0)
VLDL: 13.4 mg/dL (ref 0.0–40.0)

## 2023-09-24 LAB — ANA W/REFLEX: Anti Nuclear Antibody (ANA): NEGATIVE

## 2023-09-24 LAB — MAGNESIUM: Magnesium: 1.9 mg/dL (ref 1.5–2.5)

## 2023-09-24 LAB — HEMOGLOBIN A1C: Hgb A1c MFr Bld: 5.7 % (ref 4.6–6.5)

## 2023-09-24 LAB — VITAMIN B12: Vitamin B-12: 462 pg/mL (ref 211–911)

## 2023-09-24 LAB — RHEUMATOID FACTOR: Rheumatoid fact SerPl-aCnc: <10 [IU]/mL (ref <14)

## 2024-02-25 ENCOUNTER — Encounter: Payer: Self-pay | Admitting: Nurse Practitioner

## 2024-09-25 ENCOUNTER — Encounter: Admitting: Nurse Practitioner
